# Patient Record
Sex: Male | Born: 1990 | Race: White | Hispanic: No | Marital: Single | Smoking: Current every day smoker
Health system: Southern US, Community
[De-identification: ages and names within clinical notes are randomized; demographics above are authoritative.]

## PROBLEM LIST (undated history)

## (undated) DIAGNOSIS — J45909 Unspecified asthma, uncomplicated: Secondary | ICD-10-CM

## (undated) HISTORY — PX: WISDOM TOOTH EXTRACTION: SHX21

---
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
## 2013-12-21 ENCOUNTER — Encounter (HOSPITAL_COMMUNITY): Payer: Self-pay | Admitting: Emergency Medicine

## 2013-12-21 ENCOUNTER — Emergency Department (HOSPITAL_COMMUNITY)
Admission: EM | Admit: 2013-12-21 | Discharge: 2013-12-21 | Disposition: A | Discharge status: Home / Self Care | Payer: Self-pay | Attending: Emergency Medicine | Admitting: Emergency Medicine

## 2013-12-21 DIAGNOSIS — R1084 Generalized abdominal pain: Secondary | ICD-10-CM | POA: Insufficient documentation

## 2013-12-21 DIAGNOSIS — Z880 Allergy status to penicillin: Secondary | ICD-10-CM | POA: Insufficient documentation

## 2013-12-21 DIAGNOSIS — F17200 Nicotine dependence, unspecified, uncomplicated: Secondary | ICD-10-CM | POA: Insufficient documentation

## 2013-12-21 DIAGNOSIS — R109 Unspecified abdominal pain: Secondary | ICD-10-CM | POA: Insufficient documentation

## 2013-12-21 DIAGNOSIS — R112 Nausea with vomiting, unspecified: Secondary | ICD-10-CM | POA: Insufficient documentation

## 2013-12-21 LAB — COMPREHENSIVE METABOLIC PANEL
ALBUMIN: 3.8 g/dL (ref 3.5–5.2)
ALK PHOS: 71 U/L (ref 39–117)
ALT: 23 U/L (ref 0–53)
AST: 19 U/L (ref 0–37)
Anion gap: 13 (ref 5–15)
BUN: 16 mg/dL (ref 6–23)
CO2: 25 mEq/L (ref 19–32)
Calcium: 9.3 mg/dL (ref 8.4–10.5)
Chloride: 104 mEq/L (ref 96–112)
Creatinine, Ser: 1.11 mg/dL (ref 0.50–1.35)
GFR calc Af Amer: >90 mL/min (ref >90)
GFR calc non Af Amer: >90 mL/min (ref >90)
Glucose, Bld: 115 mg/dL — ABNORMAL HIGH (ref 70–99)
POTASSIUM: 3.8 meq/L (ref 3.7–5.3)
SODIUM: 142 meq/L (ref 137–147)
TOTAL PROTEIN: 6.9 g/dL (ref 6.0–8.3)
Total Bilirubin: 0.4 mg/dL (ref 0.3–1.2)

## 2013-12-21 LAB — URINALYSIS, ROUTINE W REFLEX MICROSCOPIC
BILIRUBIN URINE: NEGATIVE
Glucose, UA: NEGATIVE mg/dL
Hgb urine dipstick: NEGATIVE
KETONES UR: NEGATIVE mg/dL
Leukocytes, UA: NEGATIVE
NITRITE: NEGATIVE
Protein, ur: NEGATIVE mg/dL
Specific Gravity, Urine: 1.024 (ref 1.005–1.030)
UROBILINOGEN UA: 1 mg/dL (ref 0.0–1.0)
pH: 6 (ref 5.0–8.0)

## 2013-12-21 LAB — CBC WITH DIFFERENTIAL/PLATELET
BASOS PCT: 1 % (ref 0–1)
Basophils Absolute: 0.1 10*3/uL (ref 0.0–0.1)
EOS ABS: 0.2 10*3/uL (ref 0.0–0.7)
Eosinophils Relative: 3 % (ref 0–5)
HCT: 46.4 % (ref 39.0–52.0)
Hemoglobin: 16.5 g/dL (ref 13.0–17.0)
LYMPHS ABS: 3.5 10*3/uL (ref 0.7–4.0)
Lymphocytes Relative: 35 % (ref 12–46)
MCH: 30.1 pg (ref 26.0–34.0)
MCHC: 35.6 g/dL (ref 30.0–36.0)
MCV: 84.7 fL (ref 78.0–100.0)
Monocytes Absolute: 0.5 10*3/uL (ref 0.1–1.0)
Monocytes Relative: 6 % (ref 3–12)
NEUTROS ABS: 5.5 10*3/uL (ref 1.7–7.7)
NEUTROS PCT: 55 % (ref 43–77)
PLATELETS: 272 10*3/uL (ref 150–400)
RBC: 5.48 MIL/uL (ref 4.22–5.81)
RDW: 12.7 % (ref 11.5–15.5)
WBC: 9.8 10*3/uL (ref 4.0–10.5)

## 2013-12-21 LAB — LIPASE, BLOOD: Lipase: 21 U/L (ref 11–59)

## 2013-12-21 MED ORDER — ONDANSETRON HCL 4 MG/2ML IJ SOLN
4.0000 mg | Freq: Once | INTRAMUSCULAR | Status: AC
  Administered 2013-12-21: 4 mg via INTRAVENOUS

## 2013-12-21 MED ORDER — MORPHINE SULFATE 4 MG/ML IJ SOLN: 4.0000 mg | Freq: Once | INTRAMUSCULAR | Status: DC

## 2013-12-21 MED ORDER — HYDROCODONE-ACETAMINOPHEN 5-325 MG PO TABS: 1.0000 | ORAL_TABLET | Freq: Four times a day (QID) | ORAL | Status: DC | PRN

## 2013-12-21 MED ORDER — PROMETHAZINE HCL 25 MG PO TABS: 25.0000 mg | ORAL_TABLET | Freq: Four times a day (QID) | ORAL | Status: DC | PRN

## 2013-12-21 MED ORDER — SODIUM CHLORIDE 0.9 % IV BOLUS (SEPSIS)
1000.0000 mL | Freq: Once | INTRAVENOUS | Status: AC
  Administered 2013-12-21: 1000 mL via INTRAVENOUS

## 2013-12-21 NOTE — ED Provider Notes (Signed)
CSN: [id number]     Arrival date & time 12/21/13  [id number] History   First MD Initiated Contact with Patient 12/21/13 [id number]     Chief Complaint  Patient presents with  . Abdominal Pain     (Consider location/radiation/quality/duration/timing/severity/associated sxs/prior Treatment) HPI Comments: Patient is a 23 year old male who presents emergency Department with nausea, vomiting, abdominal pain since 4:30 this morning. He reports he has a cramping abdominal pain generalized in his abdomen. The pain does not radiate to his back. Emesis is nonbloody, nonbilious. He denies any diarrhea. No urinary symptoms. No fevers, chills. He ate [employer] at 2:30 this morning. He believes this could be contributing to his pain. No chest pain, shortness of breath.  Patient is a 23 y.o. male presenting with abdominal pain. The history is provided by the patient. No language interpreter was used.  Abdominal Pain Associated symptoms: nausea and vomiting   Associated symptoms: no chest pain, no chills, no diarrhea, no dysuria, no fever and no shortness of breath     History reviewed. No pertinent past medical history. History reviewed. No pertinent past surgical history. History reviewed. No pertinent family history. History  Substance Use Topics  . Smoking status: Current Some Day Smoker  . Smokeless tobacco: Not on file  . Alcohol Use: No    Review of Systems  Constitutional: Negative for fever and chills.  Respiratory: Negative for shortness of breath.   Cardiovascular: Negative for chest pain.  Gastrointestinal: Positive for nausea, vomiting and abdominal pain. Negative for diarrhea.  Genitourinary: Negative for dysuria, urgency and frequency.  All other systems reviewed and are negative.     Allergies  Penicillins  Home Medications   Prior to Admission medications   Not on File   BP 132/93  Pulse 86  Temp(Src) 97.9 F (36.6 C) (Oral)  Resp 16  Ht 5\' 10"  (1.778 m)  Wt 180 lb (81.647  kg)  BMI 25.83 kg/m2  SpO2 99% Physical Exam  Nursing note and vitals reviewed. Constitutional: He is oriented to person, place, and time. He appears well-developed and well-nourished. He does not appear ill. No distress.  HENT:  Head: Normocephalic and atraumatic.  Right Ear: External ear normal.  Left Ear: External ear normal.  Nose: Nose normal.  Eyes: Conjunctivae are normal.  Neck: Normal range of motion. No tracheal deviation present.  Cardiovascular: Normal rate, regular rhythm and normal heart sounds.   Pulmonary/Chest: Effort normal and breath sounds normal. No stridor.  Abdominal: Soft. He exhibits no distension. There is generalized tenderness. There is no rigidity, no guarding and negative Murphy's sign.  Musculoskeletal: Normal range of motion.  Neurological: He is alert and oriented to person, place, and time.  Skin: Skin is warm and dry. He is not diaphoretic.  Psychiatric: He has a normal mood and affect. His behavior is normal.    ED Course  Procedures (including critical care time) Labs Review Labs Reviewed  COMPREHENSIVE METABOLIC PANEL - Abnormal; Notable for the following:    Glucose, Bld 115 (*)    All other components within normal limits  URINE CULTURE  CBC WITH DIFFERENTIAL  LIPASE, BLOOD  URINALYSIS, ROUTINE W REFLEX MICROSCOPIC    Imaging Review No results found.   EKG Interpretation None      MDM   Final diagnoses:  Generalized abdominal pain    Patient is nontoxic, nonseptic appearing, in no apparent distress.  Patient's pain and other symptoms adequately managed in emergency department.  Fluid bolus given.  Labs, imaging  and vitals reviewed.  Patient does not meet the SIRS or Sepsis criteria.  On repeat exam patient does not have a surgical abdomin and there are nor peritoneal signs.  No indication of appendicitis, bowel obstruction, bowel perforation, cholecystitis, diverticulitis.  Patient discharged home with symptomatic treatment and  given strict instructions for follow-up with their primary care physician.  I have also discussed reasons to return immediately to the ER.  Patient expresses understanding and agrees with plan.       Mora Bellman, PA-C 12/21/13 [phone]

## 2013-12-21 NOTE — Discharge Instructions (Signed)

## 2013-12-21 NOTE — ED Notes (Signed)
Believes he has food poisoning from [employer] - having some cramping

## 2013-12-21 NOTE — ED Notes (Signed)
MD at bedside. 

## 2013-12-21 NOTE — ED Notes (Signed)
Pt declines wheelchair.

## 2013-12-21 NOTE — ED Provider Notes (Signed)
Medical screening examination/treatment/procedure(s) were performed by non-physician practitioner and as supervising physician I was immediately available for consultation/collaboration.   EKG Interpretation None       Lilyth Lawyer L Shawnese Magner, MD 12/21/13 1844 

## 2013-12-22 LAB — URINE CULTURE
COLONY COUNT: NO GROWTH
CULTURE: NO GROWTH

## 2014-05-11 ENCOUNTER — Emergency Department (HOSPITAL_COMMUNITY)
Admission: EM | Admit: 2014-05-11 | Discharge: 2014-05-12 | Disposition: A | Discharge status: Home / Self Care | Payer: Self-pay | Attending: Emergency Medicine | Admitting: Emergency Medicine

## 2014-05-11 ENCOUNTER — Encounter (HOSPITAL_COMMUNITY): Payer: Self-pay | Admitting: Emergency Medicine

## 2014-05-11 DIAGNOSIS — Z880 Allergy status to penicillin: Secondary | ICD-10-CM | POA: Insufficient documentation

## 2014-05-11 DIAGNOSIS — Z720 Tobacco use: Secondary | ICD-10-CM | POA: Insufficient documentation

## 2014-05-11 DIAGNOSIS — J029 Acute pharyngitis, unspecified: Secondary | ICD-10-CM | POA: Insufficient documentation

## 2014-05-11 MED ORDER — LIDOCAINE VISCOUS 2 % MT SOLN: 20.0000 mL | OROMUCOSAL | Status: DC | PRN

## 2014-05-11 MED ORDER — CETIRIZINE HCL 10 MG PO CAPS: 10.0000 mg | ORAL_CAPSULE | Freq: Every day | ORAL | Status: DC

## 2014-05-11 MED ORDER — IBUPROFEN 800 MG PO TABS: 800.0000 mg | ORAL_TABLET | Freq: Three times a day (TID) | ORAL | Status: DC

## 2014-05-11 MED ORDER — GUAIFENESIN 100 MG/5ML PO SOLN: 5.0000 mL | ORAL | Status: DC | PRN

## 2014-05-11 NOTE — ED Notes (Signed)
Pt. reports sore throat for 3 days with occasional dry cough , denies fever or chills. Airway intact / respirations unlabored .

## 2014-05-11 NOTE — ED Provider Notes (Signed)
CSN: [id number]     Arrival date & time 05/11/14  2320 History  This chart was scribed for Eben Burowourtney A Forcucci, PA-C, working with Richardean Canalavid H Yao, MD by Elon SpannerGarrett Cook, ED Scribe. This patient was seen in room TR05C/TR05C and the patient's care was started at 11:40 PM.    Chief Complaint  Patient presents with  . Sore Throat   The history is provided by the patient. No language interpreter was used.   HPI Comments: Alejandro Young is a 24 y.o. male who presents to the Emergency Department complaining of a sore throat with a cough productive of clear mucous onset 2 days ago.  He also reports associated intermittent rhinorrhea and congestion.  Patient denies recent sick contacts.  Patient denies any other medical conditions or taking any medications.  Patient denies fever, chills, nausea, vomiting History reviewed. No pertinent past medical history. No past surgical history on file. No family history on file. History  Substance Use Topics  . Smoking status: Current Some Day Smoker  . Smokeless tobacco: Not on file  . Alcohol Use: No    Review of Systems  Constitutional: Negative for fever and chills.  HENT: Positive for congestion, rhinorrhea and sore throat.   Respiratory: Positive for cough.   Gastrointestinal: Negative for nausea and vomiting.  All other systems reviewed and are negative.     Allergies  Amoxicillin and Penicillins  Home Medications   Prior to Admission medications   Medication Sig Start Date End Date Taking? Authorizing Provider  Cetirizine HCl (ZYRTEC ALLERGY) 10 MG CAPS Take 1 capsule (10 mg total) by mouth at bedtime. 05/11/14   Daking Westervelt A Forcucci, PA-C  guaiFENesin (ROBITUSSIN) 100 MG/5ML SOLN Take 5 mLs (100 mg total) by mouth every 4 (four) hours as needed for cough or to loosen phlegm. 05/11/14   Ronika Kelson A Forcucci, PA-C  HYDROcodone-acetaminophen (NORCO/VICODIN) 5-325 MG per tablet Take 1-2 tablets by mouth every 6 (six) hours as needed for moderate pain or  severe pain. 12/21/13   Mora BellmanHannah S Merrell, PA-C  ibuprofen (ADVIL,MOTRIN) 800 MG tablet Take 1 tablet (800 mg total) by mouth 3 (three) times daily. 05/11/14   Jhordan Mckibben A Forcucci, PA-C  lidocaine (XYLOCAINE) 2 % solution Use as directed 20 mLs in the mouth or throat as needed for mouth pain. 05/11/14   Diron Haddon A Forcucci, PA-C  promethazine (PHENERGAN) 25 MG tablet Take 1 tablet (25 mg total) by mouth every 6 (six) hours as needed for nausea or vomiting. 12/21/13   Mora BellmanHannah S Merrell, PA-C   BP 129/76 mmHg  Pulse 68  Temp(Src) 98.4 F (36.9 C) (Oral)  Resp 14  Ht 5\' 10"  (1.778 m)  Wt 190 lb (86.183 kg)  BMI 27.26 kg/m2  SpO2 94% Physical Exam  Constitutional: He is oriented to person, place, and time. He appears well-developed and well-nourished. No distress.  HENT:  Head: Normocephalic and atraumatic.  Mouth/Throat: Uvula is midline. No trismus in the jaw. Posterior oropharyngeal edema and posterior oropharyngeal erythema present. No oropharyngeal exudate.  Bilateral TM's normal.  No exudate.  There is erythema of the tonsils.  Midlline uvula rise.   Eyes: Conjunctivae and EOM are normal. Pupils are equal, round, and reactive to light. No scleral icterus.  Neck: Normal range of motion. Neck supple. No JVD present. No thyromegaly present.  Cardiovascular: Normal rate, regular rhythm, normal heart sounds and intact distal pulses.  Exam reveals no gallop and no friction rub.   No murmur heard. Pulmonary/Chest: Effort normal  and breath sounds normal. No respiratory distress. He has no wheezes. He has no rales. He exhibits no tenderness.  Musculoskeletal: Normal range of motion.  Lymphadenopathy:    He has no cervical adenopathy.  Neurological: He is alert and oriented to person, place, and time.  Skin: Skin is warm and dry.  Psychiatric: He has a normal mood and affect. His behavior is normal. Judgment and thought content normal.  Nursing note and vitals reviewed.   ED Course  Procedures  (including critical care time)  DIAGNOSTIC STUDIES: Oxygen Saturation is 94% on RA, adequate by my interpretation.    COORDINATION OF CARE:  11:42 PM Will prescribe cough medication, oral numbing rinse.  Advised patient to use ibuprofen, anti-histamine and Neti pot.  Patient acknowledges and agrees with plan.    Labs Review Labs Reviewed - No data to display  Imaging Review No results found.   EKG Interpretation None      MDM   Final diagnoses:  Viral pharyngitis   Patient is a 24 year old male who presents emergency room for evaluation of sore throat. Physical exam reveals alert nontoxic-appearing male with stable vital signs. There is no evidence for peritonsillar abscess or trismus. Patient has Centor score of 0. Suspect that this is a viral pharyngitis or sacral respiratory infection. We'll discharge home with Robitussin, viscous lidocaine, ibuprofen, and Zyrtec. Patient to use nasal saline. Patient to return for difficulty with swallowing, trismus, or any other concerning symptoms. He states understanding and agreement at this time. Patient is stable for discharge.  I personally performed the services described in this documentation, which was scribed in my presence. The recorded information has been reviewed and is accurate.   Eben Burow, PA-C 05/12/14 0000  Richardean Canal, MD 05/12/14 0700

## 2014-05-12 MED ORDER — IBUPROFEN 400 MG PO TABS
800.0000 mg | ORAL_TABLET | Freq: Once | ORAL | Status: AC
  Administered 2014-05-12: 800 mg via ORAL
  Filled 2014-05-12: qty 4

## 2014-05-12 MED ORDER — LIDOCAINE VISCOUS 2 % MT SOLN
15.0000 mL | Freq: Once | OROMUCOSAL | Status: AC
  Administered 2014-05-12: 15 mL via OROMUCOSAL
  Filled 2014-05-12: qty 15

## 2014-05-12 NOTE — Discharge Instructions (Signed)
Pharyngitis °Pharyngitis is redness, pain, and swelling (inflammation) of your pharynx.  °CAUSES  °Pharyngitis is usually caused by infection. Most of the time, these infections are from viruses (viral) and are part of a cold. However, sometimes pharyngitis is caused by bacteria (bacterial). Pharyngitis can also be caused by allergies. Viral pharyngitis may be spread from person to person by coughing, sneezing, and personal items or utensils (cups, forks, spoons, toothbrushes). Bacterial pharyngitis may be spread from person to person by more intimate contact, such as kissing.  °SIGNS AND SYMPTOMS  °Symptoms of pharyngitis include:   °· Sore throat.   °· Tiredness (fatigue).   °· Low-grade fever.   °· Headache. °· Joint pain and muscle aches. °· Skin rashes. °· Swollen lymph nodes. °· Plaque-like film on throat or tonsils (often seen with bacterial pharyngitis). °DIAGNOSIS  °Your health care provider will ask you questions about your illness and your symptoms. Your medical history, along with a physical exam, is often all that is needed to diagnose pharyngitis. Sometimes, a rapid strep test is done. Other lab tests may also be done, depending on the suspected cause.  °TREATMENT  °Viral pharyngitis will usually get better in 3-4 days without the use of medicine. Bacterial pharyngitis is treated with medicines that kill germs (antibiotics).  °HOME CARE INSTRUCTIONS  °· Drink enough water and fluids to keep your urine clear or pale yellow.   °· Only take over-the-counter or prescription medicines as directed by your health care provider:   °¨ If you are prescribed antibiotics, make sure you finish them even if you start to feel better.   °¨ Do not take aspirin.   °· Get lots of rest.   °· Gargle with 8 oz of salt water (½ tsp of salt per 1 qt of water) as often as every 1-2 hours to soothe your throat.   °· Throat lozenges (if you are not at risk for choking) or sprays may be used to soothe your throat. °SEEK MEDICAL  CARE IF:  °· You have large, tender lumps in your neck. °· You have a rash. °· You cough up green, yellow-brown, or bloody spit. °SEEK IMMEDIATE MEDICAL CARE IF:  °· Your neck becomes stiff. °· You drool or are unable to swallow liquids. °· You vomit or are unable to keep medicines or liquids down. °· You have severe pain that does not go away with the use of recommended medicines. °· You have trouble breathing (not caused by a stuffy nose). °MAKE SURE YOU:  °· Understand these instructions. °· Will watch your condition. °· Will get help right away if you are not doing well or get worse. °Document Released: 04/20/2005 Document Revised: 02/08/2013 Document Reviewed: 12/26/2012 °ExitCare® Patient Information ©2015 [employer]. This information is not intended to replace advice given to you by your health care provider. Make sure you discuss any questions you have with your health care provider. ° ° °Emergency Department Resource Guide °1) Find a Doctor and Pay Out of Pocket °Although you won't have to find out who is covered by your insurance plan, it is a good idea to ask around and get recommendations. You will then need to call the office and see if the doctor you have chosen will accept you as a new patient and what types of options they offer for patients who are self-pay. Some doctors offer discounts or will set up payment plans for their patients who do not have insurance, but you will need to ask so you aren't surprised when you get to your appointment. ° °  2) Contact Your Local Health Department °Not all health departments have doctors that can see patients for sick visits, but many do, so it is worth a call to see if yours does. If you don't know where your local health department is, you can check in your phone book. The CDC also has a tool to help you locate your state's health department, and many state websites also have listings of all of their local health departments. ° °3) Find a Walk-in Clinic °If  your illness is not likely to be very severe or complicated, you may want to try a walk in clinic. These are popping up all over the country in pharmacies, drugstores, and shopping centers. They're usually staffed by nurse practitioners or physician assistants that have been trained to treat common illnesses and complaints. They're usually fairly quick and inexpensive. However, if you have serious medical issues or chronic medical problems, these are probably not your best option. ° °No Primary Care Doctor: °- Call [facility] at  832-8000 - they can help you locate a primary care doctor that  accepts your insurance, provides certain services, etc. °- Physician Referral Service- 1-800-533-3463 ° °Chronic Pain Problems: °Organization         Address  Phone   Notes  °[facility]  (336) 297-2271 Patients need to be referred by their primary care doctor.  ° °Medication Assistance: °Organization         Address  Phone   Notes  °[place] [facility] [address] °[place], [place] [address] (336) 641-8030 --Must be a resident of [place] °-- Must have NO insurance coverage whatsoever (no Medicaid/ Medicare, etc.) °-- The pt. MUST have a primary care doctor that directs their care regularly and follows them in the community °  °[facility]  (866) 331-1348   °[employer]  (888) 892-1162   ° °Agencies that provide inexpensive medical care: °Organization         Address  Phone   Notes  °[facility]  (336) 832-8035   °[facility]    (336) 832-7272   °[facility] [address] °[place], [place] [address] (336) 832-4777   °[facility] [address], °[place] (336) 271-4999   °[facility]    (336) 373-0678   °[facility]    (336) 272-1050   °[facility] ° [address], [place] Phone:  (336) 832-4444, Fax:  (336) 832-4440 Hours of  Operation:  9 am - 6 pm, M-F.  Also accepts Medicaid/Medicare and self-pay.  °[facility] ° [address], [place] Phone: (336) 832-3150, Fax: (336) 832-3151. Hours of Operation:  8:30 am - 5:30 pm, M-F.  Also accepts Medicaid and self-pay.  °[facility] [place] [address], [place] Phone: (336) 878-6027   °[facility] [address], [place], [place] (336)723-1848, Ext. 123 Mondays & Thursdays: 7-9 AM.  First 15 patients are seen on a first come, first serve basis. °  ° °Medicaid-accepting [place] Providers: ° °Organization         Address  Phone   Notes  °[facility] [address], [place] (336) 641-2100 Also accepts self-pay patients.  °[facility] [address], [place] ° (336) 856-9996   °[facility] [address], [place] (336) 288-8857   °[facility] [address] [place] [address], [place] (336) 299-7000   °Veita Bland [address], [place]  ° (336) 373-1557 Only accepts [place] Access Medicaid patients after they have their name applied to their card.  ° °Self-Pay (no insurance) in [place]: ° °Organization         Address  Phone   Notes  °Sickle Cell Patients, [facility] [address], [place] (336) 832-1970   °[facility] [address], [place] (336) 832-4400   °[facility] ° [address], [place] (336) 992-4800   °[facility]/Dr. Osei-Bonsu ° [address] [place] [address], [place] or [address], [place] (336) 841-8500 Phone number for both [place] and [place] locations is the same.  °[facility] [address], [place] (336) 299-0000   °[facility] [address] [place] [address], [place] or [address] (336) 852-7530 °(336) 878-2260   °[facility] [address], [place] (336) 350-1642, phone; (336) 294-5005, fax Sees patients 1st and 3rd Saturday of every month.  Must not qualify for public or private insurance (i.e. Medicaid, Medicare, [place] Health Choice, Veterans' Benefits) • Household income should be no more than 200% of the poverty level •The clinic cannot treat you if you are pregnant or think you are pregnant • Sexually transmitted diseases are not treated at the clinic.  ° ° °Dental Care: °Organization         Address  Phone  Notes  °[facility] [address], [place] (336) 641-6152 Accepts children up to age 21 who are enrolled in Medicaid or [place] Health Choice; pregnant women with a Medicaid card; and children who have applied for Medicaid or [place] Health Choice, but were declined, whose parents can pay a reduced fee at time of service.  °[facility] [place]  [address], [place] (336) 641-7733 Accepts children up to age 21 who are enrolled in Medicaid or [place] Health Choice; pregnant women with a Medicaid card; and children who have applied for Medicaid or  Health Choice, but were declined, whose parents can pay a reduced fee at time of service.  °[facility] ° [address],  (336) 641-4533 Patients are seen by appointment only. Walk-ins are not accepted. [facility] will see patients 18 years of age and older. °Monday - Tuesday (8am-5pm) °Most Wednesdays (8:30-5pm) °$30 per visit, cash only  °[facility] ° [address], [place] (336) 641-4533 Patients are seen by appointment only. Walk-ins are not accepted. [facility] will see patients 18 years of age and older. °One Wednesday Evening (Monthly: Volunteer Based).  $30 per visit, cash only  °[facility]  (919) 537-3737 for adults; Children under age 4, call Graduate Pediatric  Dentistry at ([phone]) [phone]. Children aged 4-14, please call (919) [phone] to request a pediatric application. ° Dental services are provided in all areas of dental care including fillings, crowns and bridges, complete and partial dentures, implants, gum treatment, root canals, and extractions. Preventive care is also provided. Treatment is provided to both adults and children. °Patients are selected via a lottery and there is often a waiting list. °  °[facility] [address], °  [place] ° (336) 763-8833 [email or web address] °  °[facility] [address], [place], [place] (336)723-1848, Ext. 123 Second and Fourth Thursday of each month, opens at 6:30 AM; Clinic ends at 9 AM.  Patients are seen on a first-come first-served basis, and a limited number are seen during each clinic.  ° °[facility] ° [address], [place], [place] (336) 723-7904   Eligibility Requirements °You must have lived in [place], [place], or [place] counties for at least the last three months. °  You cannot be eligible for state or federal sponsored healthcare insurance, including Veterans Administration, Medicaid, or Medicare. °  You generally cannot be eligible for healthcare insurance through your employer.  °  How to apply: °Eligibility screenings are held every Tuesday and Wednesday afternoon from 1:00 pm until 4:00 pm. You do not need an appointment for the interview!  °[facility] [address], [place], [place] 336-631-2330   °[facility]  336-342-8273   °[facility]  336-703-3100   °[facility]  336-570-6415   ° °Behavioral Health Resources in the Community: °Intensive Outpatient Programs °Organization         Address  Phone  Notes  °[facility] [address], [place], [place] 336-878-6098   °[facility] [address], [place], [place] 336-832-9800   °[facility] [address], [place], [place] ° 336-882-2125   °[facility] [address],  °[place], [place] 1-800-853-5163 or 336-641-4981   °Substance Abuse Resources °Organization         Address  Phone  Notes  °[facility]  336-882-2125   °[facility]  336-784-9470   °[facility]  336-285-9073   °[facility]  336-845-3988   °Residential & Outpatient Substance Abuse Program  1-800-659-3381   °Psychological Services °Organization         Address  Phone  Notes  °[facility]  336- 832-9600   °[facility]  336- 378-7881   °[facility] [address], [place] 1-800-853-5163 or 336-641-4981   ° °Mobile Crisis Teams °Organization         Address  Phone  Notes  °[facility], Mobile Crisis Care Unit  1-877-626-1772   °Assertive °[facility] ° [address] [place], [place] 336-834-9664   °Sharon DeEsch [address] °[place] 336-554-5454   ° °Self-Help/Support Groups °Organization         Address  Phone             Notes  °[facility] - variety of support groups  336- 373-1402 Call for more information  °Narcotics Anonymous (NA), [facility] [address], °[place]  2 meetings at this location  ° °Residential Treatment Programs °Organization         Address  Phone  Notes  °[facility] [address],    °[place]  1-866-801-8205   °[facility] ° [address], [place], [place] 704-293-8524   °[facility] [address], [place] 336-845-3988 Admissions: 8am-3pm M-F  °[facility] [address]    °[place], [place] 336-841-1104   °[facility] [address], [place], [place] 336-379-7146   °[facility] [address]  °[place], [place] 336-285-9073   °[facility] - Intensive Outpatient [address],  [place], [place] 336-852-3033   °[facility] [address]  °[place], [place] 1-877-615-2722 or 336-784-9470   °[facility] ([facility] [address] [place], [place] 336-227-7417 Accepts Medicaid  °[facility] [address]  °[place] 1-800-659-3381 Substance Abuse/Addiction Treatment  ° °[place] Behavioral Health Resources °Organization         Address  Phone  Notes  °[facility]  (888) 581-9988   °Julie Brannon, PhD [address] [place], [place]   (336) 349-5553 or (336) 951-0000   °[facility]   [address] °[place], [place] (336) 349-4454   °[facility] [address], [place], [place] (336) 342-8316 Insurance/Medicaid/sponsorship through [facility]  °[facility] [address]                                    [place], [place] (336) 342-8316 Therapy/tele-psych/case  °[facility] [address]  ° [place], [place] (336) 349-2233    °Dr. Arfeen  (336) 349-4544   °[facility] [place]  [employer] [place] [facility] 1) [address], [place] °2) [address], [place] °3)  [address], [place] (336) 349-3220 °(336) 342-7768 ° °(336) 342-8140   °[place] [facility] (336) 342-1394 or (336) 342-3537 (After Hours)    ° ° ° ° °

## 2014-10-25 ENCOUNTER — Emergency Department (HOSPITAL_COMMUNITY)
Admission: EM | Admit: 2014-10-25 | Discharge: 2014-10-25 | Disposition: A | Discharge status: Home / Self Care | Payer: Self-pay | Attending: Emergency Medicine | Admitting: Emergency Medicine

## 2014-10-25 ENCOUNTER — Encounter (HOSPITAL_COMMUNITY): Payer: Self-pay | Admitting: Emergency Medicine

## 2014-10-25 DIAGNOSIS — Z720 Tobacco use: Secondary | ICD-10-CM | POA: Insufficient documentation

## 2014-10-25 DIAGNOSIS — R360 Urethral discharge without blood: Secondary | ICD-10-CM | POA: Insufficient documentation

## 2014-10-25 DIAGNOSIS — Z880 Allergy status to penicillin: Secondary | ICD-10-CM | POA: Insufficient documentation

## 2014-10-25 DIAGNOSIS — Z791 Long term (current) use of non-steroidal anti-inflammatories (NSAID): Secondary | ICD-10-CM | POA: Insufficient documentation

## 2014-10-25 DIAGNOSIS — R369 Urethral discharge, unspecified: Secondary | ICD-10-CM

## 2014-10-25 LAB — GC/CHLAMYDIA PROBE AMP (~~LOC~~) NOT AT ARMC
Chlamydia: NEGATIVE
Neisseria Gonorrhea: NEGATIVE

## 2014-10-25 MED ORDER — AZITHROMYCIN 250 MG PO TABS
1000.0000 mg | ORAL_TABLET | Freq: Once | ORAL | Status: AC
  Administered 2014-10-25: 1000 mg via ORAL
  Filled 2014-10-25: qty 4

## 2014-10-25 MED ORDER — CEFTRIAXONE SODIUM 250 MG IJ SOLR
250.0000 mg | Freq: Once | INTRAMUSCULAR | Status: AC
  Administered 2014-10-25: 250 mg via INTRAMUSCULAR
  Filled 2014-10-25: qty 250

## 2014-10-25 MED ORDER — STERILE WATER FOR INJECTION IJ SOLN
INTRAMUSCULAR | Status: AC
  Administered 2014-10-25: 1.9 mL
  Filled 2014-10-25: qty 10

## 2014-10-25 NOTE — ED Notes (Signed)
Pt c/o "yellowish-white discharge" from penis, noticed yesterday.  Pt denies pain, denies dysuria.  NAD noted at this time, resp e/u.

## 2014-10-25 NOTE — ED Provider Notes (Signed)
CSN: [id number]     Arrival date & time 10/25/14  4098 History   First MD Initiated Contact with Patient 10/25/14 0602     Chief Complaint  Patient presents with  . SEXUALLY TRANSMITTED DISEASE     (Consider location/radiation/quality/duration/timing/severity/associated sxs/prior Treatment) The history is provided by the patient and medical records.     This is a 24 y.o. M with no significant PMH presenting to the ED for urethral discharge.  Patient states he first noted this yesterday, yellow/white in color.  He denies dysuria or urinary frequency.  No abdominal pain, testicle pain, flank pain. No fever or chills. No history of STD. Patient is currently sexually active with one male partner, he does have some concern for STD. No intervention tried prior to arrival.  History reviewed. No pertinent past medical history. History reviewed. No pertinent past surgical history. No family history on file. History  Substance Use Topics  . Smoking status: Current Every Day Smoker -- 1.00 packs/day  . Smokeless tobacco: Never Used  . Alcohol Use: No    Review of Systems  Genitourinary: Positive for discharge.  All other systems reviewed and are negative.     Allergies  Amoxicillin and Penicillins  Home Medications   Prior to Admission medications   Medication Sig Start Date End Date Taking? Authorizing Provider  Cetirizine HCl (ZYRTEC ALLERGY) 10 MG CAPS Take 1 capsule (10 mg total) by mouth at bedtime. 05/11/14   Courtney Forcucci, PA-C  guaiFENesin (ROBITUSSIN) 100 MG/5ML SOLN Take 5 mLs (100 mg total) by mouth every 4 (four) hours as needed for cough or to loosen phlegm. 05/11/14   Courtney Forcucci, PA-C  HYDROcodone-acetaminophen (NORCO/VICODIN) 5-325 MG per tablet Take 1-2 tablets by mouth every 6 (six) hours as needed for moderate pain or severe pain. 12/21/13   Junious Silk, PA-C  ibuprofen (ADVIL,MOTRIN) 800 MG tablet Take 1 tablet (800 mg total) by mouth 3 (three) times  daily. 05/11/14   Courtney Forcucci, PA-C  lidocaine (XYLOCAINE) 2 % solution Use as directed 20 mLs in the mouth or throat as needed for mouth pain. 05/11/14   Courtney Forcucci, PA-C  promethazine (PHENERGAN) 25 MG tablet Take 1 tablet (25 mg total) by mouth every 6 (six) hours as needed for nausea or vomiting. 12/21/13   Junious Silk, PA-C   BP 140/89 mmHg  Pulse 64  Temp(Src) 98.5 F (36.9 C) (Oral)  Resp 14  Ht  (1.778 m)  Wt 205 lb (92.987 kg)  BMI 29.41 kg/m2  SpO2 97% Physical Exam  Constitutional: He is oriented to person, place, and time. He appears well-developed and well-nourished.  HENT:  Head: Normocephalic and atraumatic.  Mouth/Throat: Oropharynx is clear and moist.  Eyes: Conjunctivae and EOM are normal. Pupils are equal, round, and reactive to light.  Neck: Normal range of motion.  Cardiovascular: Normal rate, regular rhythm and normal heart sounds.   Pulmonary/Chest: Effort normal and breath sounds normal.  Abdominal: Soft. Bowel sounds are normal.  Genitourinary: Penis normal. Circumcised. No penile erythema or penile tenderness. No discharge found.  Musculoskeletal: Normal range of motion.  Neurological: He is alert and oriented to person, place, and time.  Skin: Skin is warm and dry.  Psychiatric: He has a normal mood and affect.  Nursing note and vitals reviewed.   ED Course  Procedures (including critical care time) Labs Review Labs Reviewed  GC/CHLAMYDIA PROBE AMP ([place]) NOT AT [facility]    Imaging Review No results found.   EKG  Interpretation None      MDM   Final diagnoses:  Urethral discharge   24 year old male here for urethral discharge. He does have concern for STD. Gonorrhea and Chlamydia swab obtained. Patient was treated empirically with azithromycin and Rocephin. He was advised he'll be notified of culture results in 24-48 hours if positive. He will follow-up with the health department for further STD needs.  Discussed plan  with patient, he/she acknowledged understanding and agreed with plan of care.  Return precautions given for new or worsening symptoms.  Garlon Hatchet, PA-C 10/25/14 0801  Loren Racer, MD 10/30/14 [phone]

## 2014-10-25 NOTE — Discharge Instructions (Signed)
You have been treated for gonorrhea and chlamydia. You will be notified of the next 24-48 hours if your culture results are positive. If so, you have artery been treated by her partner will need to be treated. You may follow-up with the health department for further STD needs. Return here for new concerns.

## 2015-11-12 ENCOUNTER — Encounter (HOSPITAL_COMMUNITY): Payer: Self-pay | Admitting: *Deleted

## 2015-11-12 ENCOUNTER — Emergency Department (HOSPITAL_COMMUNITY): Payer: Self-pay

## 2015-11-12 ENCOUNTER — Emergency Department (HOSPITAL_COMMUNITY)
Admission: EM | Admit: 2015-11-12 | Discharge: 2015-11-12 | Disposition: A | Discharge status: Home / Self Care | Payer: Self-pay | Attending: Emergency Medicine | Admitting: Emergency Medicine

## 2015-11-12 DIAGNOSIS — Z79899 Other long term (current) drug therapy: Secondary | ICD-10-CM | POA: Insufficient documentation

## 2015-11-12 DIAGNOSIS — S93402A Sprain of unspecified ligament of left ankle, initial encounter: Secondary | ICD-10-CM | POA: Insufficient documentation

## 2015-11-12 DIAGNOSIS — X503XXA Overexertion from repetitive movements, initial encounter: Secondary | ICD-10-CM | POA: Insufficient documentation

## 2015-11-12 DIAGNOSIS — Y9367 Activity, basketball: Secondary | ICD-10-CM | POA: Insufficient documentation

## 2015-11-12 DIAGNOSIS — F17200 Nicotine dependence, unspecified, uncomplicated: Secondary | ICD-10-CM | POA: Insufficient documentation

## 2015-11-12 DIAGNOSIS — Y999 Unspecified external cause status: Secondary | ICD-10-CM | POA: Insufficient documentation

## 2015-11-12 DIAGNOSIS — Y929 Unspecified place or not applicable: Secondary | ICD-10-CM | POA: Insufficient documentation

## 2015-11-12 NOTE — ED Provider Notes (Signed)
CSN: [id number]     Arrival date & time 11/12/15  1621 History  By signing my name below, I, Soijett Blue, attest that this documentation has been prepared under the direction and in the presence of Bethel Born, PA-C Electronically Signed: Soijett Blue, ED Scribe. 11/12/2015. 7:16 PM.   Chief Complaint  Patient presents with  . Ankle Pain    The history is provided by the patient. No language interpreter was used.    Alejandro Young is a 25 y.o. male who presents to the Emergency Department complaining of left ankle pain onset 2 days. He notes that he rolled his ankle while playing basketball. Pt is having associated symptoms of left ankle and left foot swelling, gait problem due to pain, bruising to left foot, and intermittent left foot pain with ambulation. He notes that he has not tried any medications for the relief of his symptoms. He denies wound, rash, left calf pain, numbness, tingling, and any other symptoms.    History reviewed. No pertinent past medical history. History reviewed. No pertinent past surgical history. History reviewed. No pertinent family history. Social History  Substance Use Topics  . Smoking status: Current Every Day Smoker -- 1.00 packs/day  . Smokeless tobacco: Never Used  . Alcohol Use: No    Review of Systems  Musculoskeletal: Positive for joint swelling (left foot and ankle), arthralgias (left foot and ankle) and gait problem (due to pain). Negative for myalgias.  Skin: Positive for color change (bruising to left foot). Negative for wound.  Neurological: Negative for numbness.       No tingling      Allergies  Amoxicillin and Penicillins  Home Medications   Prior to Admission medications   Medication Sig Start Date End Date Taking? Authorizing Provider  Cetirizine HCl (ZYRTEC ALLERGY) 10 MG CAPS Take 1 capsule (10 mg total) by mouth at bedtime. 05/11/14   Courtney Forcucci, PA-C  guaiFENesin (ROBITUSSIN) 100 MG/5ML SOLN Take 5 mLs (100 mg  total) by mouth every 4 (four) hours as needed for cough or to loosen phlegm. 05/11/14   Courtney Forcucci, PA-C  HYDROcodone-acetaminophen (NORCO/VICODIN) 5-325 MG per tablet Take 1-2 tablets by mouth every 6 (six) hours as needed for moderate pain or severe pain. 12/21/13   Junious Silk, PA-C  ibuprofen (ADVIL,MOTRIN) 800 MG tablet Take 1 tablet (800 mg total) by mouth 3 (three) times daily. 05/11/14   Courtney Forcucci, PA-C  lidocaine (XYLOCAINE) 2 % solution Use as directed 20 mLs in the mouth or throat as needed for mouth pain. 05/11/14   Courtney Forcucci, PA-C  promethazine (PHENERGAN) 25 MG tablet Take 1 tablet (25 mg total) by mouth every 6 (six) hours as needed for nausea or vomiting. 12/21/13   Junious Silk, PA-C   BP 133/78 mmHg  Pulse 93  Temp(Src) 99.1 F (37.3 C) (Oral)  Ht  (1.778 m)  Wt 190 lb (86.183 kg)  BMI 27.26 kg/m2  SpO2 98%   Physical Exam  Constitutional: He is oriented to person, place, and time. He appears well-developed and well-nourished. No distress.  HENT:  Head: Normocephalic and atraumatic.  Eyes: EOM are normal.  Neck: Neck supple.  Cardiovascular: Normal rate.   Pulmonary/Chest: Effort normal. No respiratory distress.  Abdominal: He exhibits no distension.  Musculoskeletal:       Left ankle: He exhibits decreased range of motion (due to swelling), swelling and ecchymosis. Tenderness.       Left foot: There is swelling.  Significant amount  of swelling over circumferential ankle and dorsal aspect of left foot. Ecchymosis around toes and the lateral aspect of ankle. Mild TTP of left ankle. Decreased ROM of ankle due to swelling. FROM of toes. NVI.   Neurological: He is alert and oriented to person, place, and time.  Skin: Skin is warm and dry.  Psychiatric: He has a normal mood and affect. His behavior is normal.  Nursing note and vitals reviewed.   ED Course  Procedures (including critical care time) DIAGNOSTIC STUDIES: Oxygen Saturation is  98% on RA, nl by my interpretation.    COORDINATION OF CARE: 7:16 PM Discussed treatment plan with pt at bedside which includes left ankle xray, brace, ibuprofen, and RICE therapy, and pt agreed to plan.    Imaging Review Dg Ankle Complete Left  11/12/2015  CLINICAL DATA:  Left ankle pain status post basketball injury. EXAM: LEFT ANKLE COMPLETE - 3+ VIEW COMPARISON:  None. FINDINGS: No other acute fracture or dislocation. Small ankle joint effusion. Soft tissue swelling over the lateral malleolus. IMPRESSION: No acute osseous injury of the left ankle. Soft tissue swelling over the lateral malleolus. Electronically Signed   By: Elige KoHetal  Patel   On: 11/12/2015 17:56   I have personally reviewed and evaluated these images as part of my medical decision-making.   MDM   Final diagnoses:  Ankle sprain, left, initial encounter   25 year old male who presents with left ankle sprain. Xray negative for fracture. ASO brace applied. Patient offered crutches and medicine for pain but he declined. RICE protocol recommended and discussed. Patient is NAD, non-toxic, with stable VS. Patient is informed of clinical course, understands medical decision making process, and agrees with plan. Opportunity for questions provided and all questions answered. Return precautions given.  I personally performed the services described in this documentation, which was scribed in my presence. The recorded information has been reviewed and is accurate.    Bethel BornKelly Marie Cadon Raczka, PA-C 11/14/15 1440  Marily MemosJason Mesner, MD 11/15/15 [phone]

## 2015-11-12 NOTE — Discharge Instructions (Signed)
Ankle Sprain  An ankle sprain is an injury to the strong, fibrous tissues (ligaments) that hold the bones of your ankle joint together.   CAUSES  An ankle sprain is usually caused by a fall or by twisting your ankle. Ankle sprains most commonly occur when you step on the outer edge of your foot, and your ankle turns inward. People who participate in sports are more prone to these types of injuries.   SYMPTOMS    Pain in your ankle. The pain may be present at rest or only when you are trying to stand or walk.   Swelling.   Bruising. Bruising may develop immediately or within 1 to 2 days after your injury.   Difficulty standing or walking, particularly when turning corners or changing directions.  DIAGNOSIS   Your caregiver will ask you details about your injury and perform a physical exam of your ankle to determine if you have an ankle sprain. During the physical exam, your caregiver will press on and apply pressure to specific areas of your foot and ankle. Your caregiver will try to move your ankle in certain ways. An X-ray exam may be done to be sure a bone was not broken or a ligament did not separate from one of the bones in your ankle (avulsion fracture).   TREATMENT   Certain types of braces can help stabilize your ankle. Your caregiver can make a recommendation for this. Your caregiver may recommend the use of medicine for pain. If your sprain is severe, your caregiver may refer you to a surgeon who helps to restore function to parts of your skeletal system (orthopedist) or a physical therapist.  HOME CARE INSTRUCTIONS    Apply ice to your injury for 1-2 days or as directed by your caregiver. Applying ice helps to reduce inflammation and pain.    Put ice in a plastic bag.    Place a towel between your skin and the bag.    Leave the ice on for 15-20 minutes at a time, every 2 hours while you are awake.   Only take over-the-counter or prescription medicines for pain, discomfort, or fever as directed by  your caregiver.   Elevate your injured ankle above the level of your heart as much as possible for 2-3 days.   If your caregiver recommends crutches, use them as instructed. Gradually put weight on the affected ankle. Continue to use crutches or a cane until you can walk without feeling pain in your ankle.   If you have a plaster splint, wear the splint as directed by your caregiver. Do not rest it on anything harder than a pillow for the first 24 hours. Do not put weight on it. Do not get it wet. You may take it off to take a shower or bath.   You may have been given an elastic bandage to wear around your ankle to provide support. If the elastic bandage is too tight (you have numbness or tingling in your foot or your foot becomes cold and blue), adjust the bandage to make it comfortable.   If you have an air splint, you may blow more air into it or let air out to make it more comfortable. You may take your splint off at night and before taking a shower or bath. Wiggle your toes in the splint several times per day to decrease swelling.  SEEK MEDICAL CARE IF:    You have rapidly increasing bruising or swelling.   Your toes feel   extremely cold or you lose feeling in your foot.   Your pain is not relieved with medicine.  SEEK IMMEDIATE MEDICAL CARE IF:   Your toes are numb or blue.   You have severe pain that is increasing.  MAKE SURE YOU:    Understand these instructions.   Will watch your condition.   Will get help right away if you are not doing well or get worse.     This information is not intended to replace advice given to you by your health care provider. Make sure you discuss any questions you have with your health care provider.     Document Released: 04/20/2005 Document Revised: 05/11/2014 Document Reviewed: 05/02/2011  Elsevier Interactive Patient Education 2016 [employer]

## 2015-11-12 NOTE — ED Notes (Addendum)
Pt rolled left ankle on Sunday while playing basketball. Pt presents with swelling to left ankle. Pt wants to know if ankle is broken.

## 2016-11-01 ENCOUNTER — Encounter (HOSPITAL_COMMUNITY): Payer: Self-pay | Admitting: *Deleted

## 2016-11-01 ENCOUNTER — Emergency Department (HOSPITAL_COMMUNITY)
Admission: EM | Admit: 2016-11-01 | Discharge: 2016-11-01 | Disposition: A | Discharge status: Home / Self Care | Payer: 59 | Attending: Emergency Medicine | Admitting: Emergency Medicine

## 2016-11-01 DIAGNOSIS — F17200 Nicotine dependence, unspecified, uncomplicated: Secondary | ICD-10-CM | POA: Diagnosis not present

## 2016-11-01 DIAGNOSIS — T148XXA Other injury of unspecified body region, initial encounter: Secondary | ICD-10-CM

## 2016-11-01 DIAGNOSIS — Y9389 Activity, other specified: Secondary | ICD-10-CM | POA: Diagnosis not present

## 2016-11-01 DIAGNOSIS — Y998 Other external cause status: Secondary | ICD-10-CM | POA: Diagnosis not present

## 2016-11-01 DIAGNOSIS — M545 Low back pain, unspecified: Secondary | ICD-10-CM

## 2016-11-01 DIAGNOSIS — Y929 Unspecified place or not applicable: Secondary | ICD-10-CM | POA: Insufficient documentation

## 2016-11-01 DIAGNOSIS — Z79899 Other long term (current) drug therapy: Secondary | ICD-10-CM | POA: Diagnosis not present

## 2016-11-01 DIAGNOSIS — S39012A Strain of muscle, fascia and tendon of lower back, initial encounter: Secondary | ICD-10-CM | POA: Diagnosis not present

## 2016-11-01 DIAGNOSIS — X500XXA Overexertion from strenuous movement or load, initial encounter: Secondary | ICD-10-CM | POA: Diagnosis not present

## 2016-11-01 MED ORDER — METHOCARBAMOL 500 MG PO TABS: 500.0000 mg | ORAL_TABLET | Freq: Two times a day (BID) | ORAL | 0 refills | Status: DC

## 2016-11-01 MED ORDER — IBUPROFEN 600 MG PO TABS: 600.0000 mg | ORAL_TABLET | Freq: Four times a day (QID) | ORAL | 0 refills | Status: DC | PRN

## 2016-11-01 NOTE — ED Provider Notes (Signed)
[facility]-EMERGENCY DEPT Provider Note    By signing my name below, I, Earmon PhoenixJennifer Waddell, attest that this documentation has been prepared under the direction and in the presence of Graciella FreerLindsey Jamesha Ellsworth, PA-C. Electronically Signed: Earmon PhoenixJennifer Waddell, ED Scribe. 11/01/16. 1:55 PM.    History   Chief Complaint Chief Complaint  Patient presents with  . Back Pain    The history is provided by the patient and medical records. No language interpreter was used.    Alejandro Young is a 26 y.o. male who presents to the Emergency Department complaining of mid to lower back pain that began last night. He reports associated sharp pain that radiates from the mid back to the right shoulder blade. He reports lifting his son last night prior to the pain beginning. He has not taken anything for pain. Bending increases the pain. Sitting straight up helps alleviate the pain. He denies numbness, tingling or weakness of any extremity, saddle anesthesia, fever, chills, bowel or bladder incontinence, dysuria, hematuria, neck pain, rash. He denies h/o back problems or surgeries or IVDA. He reports an allergy to PCN. He does not currently have a PCP.    History reviewed. No pertinent past medical history.  There are no active problems to display for this patient.   History reviewed. No pertinent surgical history.     Home Medications    Prior to Admission medications   Medication Sig Start Date End Date Taking? Authorizing Provider  Cetirizine HCl (ZYRTEC ALLERGY) 10 MG CAPS Take 1 capsule (10 mg total) by mouth at bedtime. 05/11/14   Forcucci, Courtney, PA-C  guaiFENesin (ROBITUSSIN) 100 MG/5ML SOLN Take 5 mLs (100 mg total) by mouth every 4 (four) hours as needed for cough or to loosen phlegm. 05/11/14   Forcucci, Courtney, PA-C  HYDROcodone-acetaminophen (NORCO/VICODIN) 5-325 MG per tablet Take 1-2 tablets by mouth every 6 (six) hours as needed for moderate pain or severe pain. 12/21/13   Junious SilkMerrell, Hannah, PA-C    ibuprofen (ADVIL,MOTRIN) 600 MG tablet Take 1 tablet (600 mg total) by mouth every 6 (six) hours as needed. 11/01/16   Maxwell CaulLayden, Kainoah Bartosiewicz A, PA-C  lidocaine (XYLOCAINE) 2 % solution Use as directed 20 mLs in the mouth or throat as needed for mouth pain. 05/11/14   Forcucci, Courtney, PA-C  methocarbamol (ROBAXIN) 500 MG tablet Take 1 tablet (500 mg total) by mouth 2 (two) times daily. 11/01/16   Maxwell CaulLayden, Karmah Potocki A, PA-C  promethazine (PHENERGAN) 25 MG tablet Take 1 tablet (25 mg total) by mouth every 6 (six) hours as needed for nausea or vomiting. 12/21/13   Junious SilkMerrell, Hannah, PA-C    Family History History reviewed. No pertinent family history.  Social History Social History  Substance Use Topics  . Smoking status: Current Every Day Smoker    Packs/day: 1.00  . Smokeless tobacco: Never Used  . Alcohol use No     Allergies   Amoxicillin and Penicillins   Review of Systems Review of Systems  Constitutional: Negative for chills and fever.  Gastrointestinal:       No bowel or bladder incontinence  Genitourinary: Negative for dysuria and hematuria.  Musculoskeletal: Positive for back pain. Negative for neck pain.  Skin: Negative for rash.  Neurological: Negative for weakness and numbness.     Physical Exam Updated Vital Signs BP 134/81 (BP Location: Right Arm)   Pulse 80   Temp 98.6 F (37 C) (Oral)   Resp 18   SpO2 99%   Physical Exam  Constitutional: He is  oriented to person, place, and time. He appears well-developed and well-nourished.  Sitting comfortably on examination table  HENT:  Head: Normocephalic and atraumatic.  Eyes: Conjunctivae and EOM are normal. Right eye exhibits no discharge. Left eye exhibits no discharge. No scleral icterus.  Neck: Full passive range of motion without pain. Neck supple. No neck rigidity.  Full flexion/extension and lateral movement of neck fully intact. No bony midline tenderness. No deformities or crepitus.  Cardiovascular: Normal pulses.    Pulmonary/Chest: Effort normal.  Abdominal: There is no CVA tenderness.  No CVA tenderness bilaterally  Musculoskeletal: Normal range of motion. He exhibits tenderness. He exhibits no edema or deformity.  Diffuse TTP over right trapezius. Diffuse muscular tenderness overlying the lumbar region. No warm, erythema or ecchymosis noted. No bony tenderness. No deformities or crepitus. Flexion and extension of back intact without difficulty. FROM BUE. No tenderness to palpation to bilateral shoulders, clavicles. No deformities or crepitus.   Neurological: He is alert and oriented to person, place, and time. GCS eye subscore is 4. GCS verbal subscore is 5. GCS motor subscore is 6.  Follows commands, Moves all extremities  5/5 strength to BUE and BLE  Sensation intact throughout all major nerve distributions.  No gait abnormalities  Skin: Skin is warm and dry. No rash noted.  Psychiatric: He has a normal mood and affect. His speech is normal and behavior is normal.  Nursing note and vitals reviewed.    ED Treatments / Results  DIAGNOSTIC STUDIES: Oxygen Saturation is 99% on RA, normal by my interpretation.   COORDINATION OF CARE: 1:51 PM- Will prescribe muscle relaxer and NSAID. Will give resources to establish care with a PCP. Pt verbalizes understanding and agrees to plan.  Medications - No data to display  Labs (all labs ordered are listed, but only abnormal results are displayed) Labs Reviewed - No data to display  EKG  EKG Interpretation None       Radiology No results found.  Procedures Procedures (including critical care time)  Medications Ordered in ED Medications - No data to display   Initial Impression / Assessment and Plan / ED Course  I have reviewed the triage vital signs and the nursing notes.  Pertinent labs & imaging results that were available during my care of the patient were reviewed by me and considered in my medical decision making (see chart for  details).     26 yo M with back pain that began yesterday after lifting his son. Patient is afebrile, non-toxic appearing, sitting comfortably on examination table. Vital signs reviewed and stable.  No red flag symptoms.  No neurological deficits on exam. History/physical exam are not concerning for cauda equina or spinal abscess. No urinary symptoms suggestive of UTI. No rash concerning for shingles. No indications for imaging at this time given lack of trauma/injury and history/physical exam. Consider  musculoskeletal strain vs mechanical back pain. RICE protocol and pain medicine indicated and discussed with patient. Provided patient with a list of clinic resources to use if he does not have a PCP. Instructed to call them today to arrange follow-up in the next 24-48 hours. Return precautions discussed. Patient expresses understanding and agreement to plan.     Final Clinical Impressions(s) / ED Diagnoses   Final diagnoses:  Acute bilateral low back pain without sciatica  Muscle strain    New Prescriptions Discharge Medication List as of 11/01/2016  2:32 PM    START taking these medications   Details  methocarbamol (ROBAXIN)  500 MG tablet Take 1 tablet (500 mg total) by mouth 2 (two) times daily., Starting Sun 11/01/2016, Print        I personally performed the services described in this documentation, which was scribed in my presence. The recorded information has been reviewed and is accurate.     Maxwell Caul, PA-C 11/01/16 1438    Gerhard Munch, MD 11/01/16 1725

## 2016-11-01 NOTE — ED Notes (Signed)
C/o right upper back pain as well as pain across lower back since lifting child last pm.

## 2016-11-01 NOTE — Discharge Instructions (Addendum)
Follow-up with your primary care doctor in the next 24-48 hour for further evaluation.   If you do not have a primary care doctor, you can use one provided in the list below.   Take Robaxin as prescribed.   You can take Tylenol or Ibuprofen as directed for pain.  Return to the Emergency Department for: Fever  Leg weakness Urinary or bowel accidents  Abdominal pain Worsening or severe pain    Back Pain:   Your back pain should be treated with medicines such as ibuprofen or aleve and this back pain should get better over the next 2 weeks.  However if you develop severe or worsening pain, low back pain with fever, numbness, weakness or inability to walk or urinate, you should return to the ER immediately.    Please follow up with your doctor this week for a recheck if still having symptoms . Low back pain is discomfort in the lower back that may be due to injuries to muscles and ligaments around the spine.  Occasionally, it may be caused by a a problem to a part of the spine called a disc.  The pain may last several days or a week;  However, most patients get completely well in 4 weeks.  Self - care:  The application of heat can help soothe the pain.  Maintaining your daily activities, including walking, is encourged, as it will help you get better faster than just staying in bed.  Medications are also useful to help with pain control.  A commonly prescribed medications includes acetaminophen.  This medication is generally safe, though you should not take more than 8 of the extra strength (500mg ) pills a day.  Non steroidal anti inflammatory medications including Ibuprofen and naproxen;  These medications help both pain and swelling and are very useful in treating back pain.  They should be taken with food, as they can cause stomach upset, and more seriously, stomach bleeding.    Muscle relaxants:  These medications can help with muscle tightness that is a cause of lower back pain.  Most  of these medications can cause drowsiness, and it is not safe to drive or use dangerous machinery while taking them.    If you do not have a primary care doctor you see regularly, please you the list below. Please call them to arrange for follow-up.    No Primary Care Doctor Call [facility]  [phone] Other agencies that provide inexpensive medical care    [facility]  [phone]    [facility]  [phone]    [facility]  [phone]    [facility]  [phone]    [facility]  [phone]    [facility]  [phone]

## 2016-11-01 NOTE — ED Triage Notes (Signed)
Pt reports picking up his son last night and onset of lower and mid back pain. Ambulatory at triage.

## 2017-01-20 ENCOUNTER — Emergency Department (HOSPITAL_BASED_OUTPATIENT_CLINIC_OR_DEPARTMENT_OTHER)
Admission: EM | Admit: 2017-01-20 | Discharge: 2017-01-20 | Disposition: A | Discharge status: Home / Self Care | Payer: 59 | Attending: Emergency Medicine | Admitting: Emergency Medicine

## 2017-01-20 ENCOUNTER — Emergency Department (HOSPITAL_BASED_OUTPATIENT_CLINIC_OR_DEPARTMENT_OTHER): Payer: 59

## 2017-01-20 ENCOUNTER — Encounter (HOSPITAL_BASED_OUTPATIENT_CLINIC_OR_DEPARTMENT_OTHER): Payer: Self-pay | Admitting: *Deleted

## 2017-01-20 DIAGNOSIS — F17200 Nicotine dependence, unspecified, uncomplicated: Secondary | ICD-10-CM | POA: Insufficient documentation

## 2017-01-20 DIAGNOSIS — Z79899 Other long term (current) drug therapy: Secondary | ICD-10-CM | POA: Insufficient documentation

## 2017-01-20 DIAGNOSIS — R0609 Other forms of dyspnea: Secondary | ICD-10-CM | POA: Diagnosis present

## 2017-01-20 DIAGNOSIS — J069 Acute upper respiratory infection, unspecified: Secondary | ICD-10-CM | POA: Insufficient documentation

## 2017-01-20 MED ORDER — PREDNISONE 50 MG PO TABS
50.0000 mg | ORAL_TABLET | Freq: Once | ORAL | Status: AC
  Administered 2017-01-20: 50 mg via ORAL
  Filled 2017-01-20: qty 1

## 2017-01-20 MED ORDER — PREDNISONE 50 MG PO TABS: 50.0000 mg | ORAL_TABLET | Freq: Every day | ORAL | 0 refills | Status: DC

## 2017-01-20 NOTE — ED Provider Notes (Signed)
[facility] Provider Note   CSN: [id number] Arrival date & time: 01/20/17  1529     History   Chief Complaint Chief Complaint  Patient presents with  . URI    HPI Alejandro Young is a 26 y.o. male presenting with 1 week of URI symptoms and 2 days of DOE at work. He was given an inhaler by work doctor and a z-pack. He still has DOE at work today despite inhaler use. He is coughing up clear mucous. He reports chills, unsure if he has a fever. Has body aches as well that come and go. No nausea, vomiting,   Tobacco user 1/2 pack per day and occasional marijuana use, no IV drug use, rare alcohol.   HPI  History reviewed. No pertinent past medical history.  There are no active problems to display for this patient.   History reviewed. No pertinent surgical history.   Home Medications    Prior to Admission medications   Medication Sig Start Date End Date Taking? Authorizing Provider  albuterol (PROVENTIL HFA;VENTOLIN HFA) 108 (90 Base) MCG/ACT inhaler Inhale into the lungs every 6 (six) hours as needed for wheezing or shortness of breath.   Yes [provider]  azithromycin (ZITHROMAX) 500 MG tablet Take by mouth daily.   Yes [provider]  Cetirizine HCl (ZYRTEC ALLERGY) 10 MG CAPS Take 1 capsule (10 mg total) by mouth at bedtime. 05/11/14   Young, Courtney, PA-C  guaiFENesin (ROBITUSSIN) 100 MG/5ML SOLN Take 5 mLs (100 mg total) by mouth every 4 (four) hours as needed for cough or to loosen phlegm. 05/11/14   Young, Courtney, PA-C  ibuprofen (ADVIL,MOTRIN) 600 MG tablet Take 1 tablet (600 mg total) by mouth every 6 (six) hours as needed. 11/01/16   Alejandro Caul, PA-C  lidocaine (XYLOCAINE) 2 % solution Use as directed 20 mLs in the mouth or throat as needed for mouth pain. 05/11/14   Young, Courtney, PA-C  methocarbamol (ROBAXIN) 500 MG tablet Take 1 tablet (500 mg total) by mouth 2 (two) times daily. 11/01/16   Alejandro Caul, PA-C    predniSONE (DELTASONE) 50 MG tablet Take 1 tablet (50 mg total) by mouth daily with breakfast. 01/20/17   Tillman Sers, DO  promethazine (PHENERGAN) 25 MG tablet Take 1 tablet (25 mg total) by mouth every 6 (six) hours as needed for nausea or vomiting. 12/21/13   Alejandro Silk, PA-C    Family History History reviewed. No pertinent family history.  Social History Social History  Substance Use Topics  . Smoking status: Current Every Day Smoker    Packs/day: 1.00  . Smokeless tobacco: Never Used  . Alcohol use No     Allergies   Amoxicillin and Penicillins   Review of Systems Review of Systems  Constitutional: Positive for chills. Negative for activity change, appetite change and fever.  HENT: Positive for congestion and sore throat. Negative for rhinorrhea.   Eyes: Negative for pain and discharge.  Respiratory: Positive for cough, chest tightness and shortness of breath. Negative for wheezing.   Cardiovascular: Negative for chest pain and palpitations.  Gastrointestinal: Negative for abdominal pain, constipation, diarrhea, nausea and vomiting.  Genitourinary: Negative for difficulty urinating, dysuria and frequency.  Musculoskeletal: Positive for myalgias. Negative for arthralgias.  Skin: Negative for pallor, rash and wound.  Neurological: Negative for seizures, weakness, numbness and headaches.     Physical Exam Updated Vital Signs Ht  (1.778 m)   Wt 95.3 kg (210 lb)  BMI 30.13 kg/m   Physical Exam  Constitutional: He is oriented to person, place, and time. He appears well-developed and well-nourished. No distress.  HENT:  Head: Normocephalic and atraumatic.  Mouth/Throat: Oropharynx is clear and moist. No oropharyngeal exudate.  Eyes: Pupils are equal, round, and reactive to light. EOM are normal. Right eye exhibits no discharge. Left eye exhibits no discharge.  Neck: Normal range of motion. Neck supple.  Cardiovascular: Normal rate and regular rhythm.    No murmur heard. Pulmonary/Chest: Effort normal and breath sounds normal. No respiratory distress. He has no wheezes. He has no rales.  Abdominal: Soft. He exhibits no distension. There is no tenderness.  Musculoskeletal: Normal range of motion. He exhibits no deformity.  Lymphadenopathy:    He has no cervical adenopathy.  Neurological: He is alert and oriented to person, place, and time. He exhibits normal muscle tone.  Skin: Skin is warm and dry. No rash noted.  Psychiatric: He has a normal mood and affect. His behavior is normal. Judgment and thought content normal.  Nursing note and vitals reviewed.    ED Treatments / Results  Labs (all labs ordered are listed, but only abnormal results are displayed) Labs Reviewed - No data to display  EKG  EKG Interpretation None       Radiology Dg Chest 2 View  Result Date: 01/20/2017 CLINICAL DATA:  Cough, shortness of breath, and chills for 4 days. EXAM: CHEST  2 VIEW COMPARISON:  None. FINDINGS: The heart size and mediastinal contours are within normal limits. Both lungs are clear. The visualized skeletal structures are unremarkable. IMPRESSION: Negative.  No active cardiopulmonary disease. Electronically Signed   By: Myles Rosenthal M.D.   On: 01/20/2017 17:41    Procedures Procedures (including critical care time)  Medications Ordered in ED Medications  predniSONE (DELTASONE) tablet 50 mg (50 mg Oral Given 01/20/17 1803)     Initial Impression / Assessment and Plan / ED Course  I have reviewed the triage vital signs and the nursing notes.  Pertinent labs & imaging results that were available during my care of the patient were reviewed by me and considered in my medical decision making (see chart for details).     26 year old male presenting with 1 week of URI symptoms and 2 days of DOE while at work. No improvement with albuterol inhaler. On day 2 of z-pack. Well appearing with benign exam. CXR without acute cardiopulmonary  findings. Given 50 mg prednisone in ED and prescription to complete 5 day burst of steroids. Instructed tylenol and ibuprofen for fever or pain. Complete course of Azithromycin. Follow up with PCP if symptoms worsen or fail to improve. Stable for discharge home. Patient verbalized understanding and agreement with plan.  Final Clinical Impressions(s) / ED Diagnoses   Final diagnoses:  Upper respiratory tract infection, unspecified type    New Prescriptions Discharge Medication List as of 01/20/2017  5:59 PM    START taking these medications   Details  predniSONE (DELTASONE) 50 MG tablet Take 1 tablet (50 mg total) by mouth daily with breakfast., Starting Wed 01/20/2017, Print         Tillman Sers, DO 01/20/17 1933    Rolland Porter, MD 02/01/17 2210

## 2017-01-20 NOTE — ED Triage Notes (Signed)
Pt c/o URi Symptoms  x 3 days

## 2017-01-20 NOTE — ED Notes (Signed)
ED Provider at bedside. 

## 2017-01-20 NOTE — ED Notes (Signed)
Patient transported to X-ray 

## 2018-02-14 ENCOUNTER — Emergency Department (HOSPITAL_COMMUNITY): Payer: Self-pay

## 2018-02-14 ENCOUNTER — Encounter (HOSPITAL_COMMUNITY): Payer: Self-pay | Admitting: Emergency Medicine

## 2018-02-14 ENCOUNTER — Other Ambulatory Visit: Payer: Self-pay

## 2018-02-14 ENCOUNTER — Emergency Department (HOSPITAL_COMMUNITY)
Admission: EM | Admit: 2018-02-14 | Discharge: 2018-02-14 | Disposition: A | Discharge status: Home / Self Care | Payer: Self-pay | Attending: Emergency Medicine | Admitting: Emergency Medicine

## 2018-02-14 DIAGNOSIS — Z79899 Other long term (current) drug therapy: Secondary | ICD-10-CM | POA: Insufficient documentation

## 2018-02-14 DIAGNOSIS — J988 Other specified respiratory disorders: Secondary | ICD-10-CM

## 2018-02-14 DIAGNOSIS — J069 Acute upper respiratory infection, unspecified: Secondary | ICD-10-CM | POA: Insufficient documentation

## 2018-02-14 DIAGNOSIS — F17210 Nicotine dependence, cigarettes, uncomplicated: Secondary | ICD-10-CM | POA: Insufficient documentation

## 2018-02-14 LAB — CBC
HEMATOCRIT: 48.4 % (ref 39.0–52.0)
Hemoglobin: 15.7 g/dL (ref 13.0–17.0)
MCH: 28.1 pg (ref 26.0–34.0)
MCHC: 32.4 g/dL (ref 30.0–36.0)
MCV: 86.6 fL (ref 80.0–100.0)
Platelets: 330 10*3/uL (ref 150–400)
RBC: 5.59 MIL/uL (ref 4.22–5.81)
RDW: 13.2 % (ref 11.5–15.5)
WBC: 7.7 10*3/uL (ref 4.0–10.5)
nRBC: 0 % (ref 0.0–0.2)

## 2018-02-14 LAB — BASIC METABOLIC PANEL
Anion gap: 10 (ref 5–15)
BUN: 9 mg/dL (ref 6–20)
CO2: 18 mmol/L — AB (ref 22–32)
CREATININE: 1.03 mg/dL (ref 0.61–1.24)
Calcium: 9 mg/dL (ref 8.9–10.3)
Chloride: 111 mmol/L (ref 98–111)
GFR calc Af Amer: >60 mL/min (ref >60)
GFR calc non Af Amer: >60 mL/min (ref >60)
Glucose, Bld: 99 mg/dL (ref 70–99)
Potassium: 4.4 mmol/L (ref 3.5–5.1)
SODIUM: 139 mmol/L (ref 135–145)

## 2018-02-14 LAB — I-STAT TROPONIN, ED: TROPONIN I, POC: 0 ng/mL (ref 0.00–0.08)

## 2018-02-14 MED ORDER — AZITHROMYCIN 250 MG PO TABS: 250.0000 mg | ORAL_TABLET | Freq: Every day | ORAL | 0 refills | Status: DC

## 2018-02-14 MED ORDER — BENZONATATE 100 MG PO CAPS: 100.0000 mg | ORAL_CAPSULE | Freq: Three times a day (TID) | ORAL | 0 refills | Status: DC

## 2018-02-14 NOTE — Discharge Instructions (Addendum)
Please read attached information. If you experience any new or worsening signs or symptoms please return to the emergency room for evaluation. Please follow-up with your primary care provider or specialist as discussed. Please use medication prescribed only as directed and discontinue taking if you have any concerning signs or symptoms.   °

## 2018-02-14 NOTE — ED Notes (Signed)
ED Provider at bedside. 

## 2018-02-14 NOTE — ED Triage Notes (Signed)
Pt c.o. Left chest pain with shortness of breath since last night. Pt also appears to have some congestion. Pt is a smoker.

## 2018-02-14 NOTE — ED Provider Notes (Signed)
[facility] EMERGENCY DEPARTMENT Provider Note   CSN: [id number] Arrival date & time: 02/14/18  1034     History   Chief Complaint Chief Complaint  Patient presents with  . Chest Pain  . Shortness of Breath    HPI Alejandro Young is a 27 y.o. male.  HPI    27 year old male presents today with complaints of upper respiratory infection.  Patient notes a one-week history of cough, nasal congestion.  Patient notes that he developed some minor shortness of breath yesterday and anterior chest pain today.  He notes the pain started this morning feeling like pressure worse with coughing.  Patient notes he has been taking cough and cold medication at home without significant improvement in his symptoms.  Patient denies any fever.  He reports he is a smoker, no asthma history.  Pt denies any cardiac history.   History reviewed. No pertinent past medical history.  There are no active problems to display for this patient.   History reviewed. No pertinent surgical history.      Home Medications    Prior to Admission medications   Medication Sig Start Date End Date Taking? Authorizing Provider  albuterol (PROVENTIL HFA;VENTOLIN HFA) 108 (90 Base) MCG/ACT inhaler Inhale into the lungs every 6 (six) hours as needed for wheezing or shortness of breath.    [provider]  azithromycin (ZITHROMAX) 250 MG tablet Take 1 tablet (250 mg total) by mouth daily. Take first 2 tablets together, then 1 every day until finished. 02/14/18   Dunya Meiners, Tinnie Gens, PA-C  benzonatate (TESSALON) 100 MG capsule Take 1 capsule (100 mg total) by mouth every 8 (eight) hours. 02/14/18   Ahmarion Saraceno, Tinnie Gens, PA-C  Cetirizine HCl (ZYRTEC ALLERGY) 10 MG CAPS Take 1 capsule (10 mg total) by mouth at bedtime. 05/11/14   Forcucci, Courtney, PA-C  guaiFENesin (ROBITUSSIN) 100 MG/5ML SOLN Take 5 mLs (100 mg total) by mouth every 4 (four) hours as needed for cough or to loosen phlegm. 05/11/14    Forcucci, Courtney, PA-C  ibuprofen (ADVIL,MOTRIN) 600 MG tablet Take 1 tablet (600 mg total) by mouth every 6 (six) hours as needed. 11/01/16   Maxwell Caul, PA-C  lidocaine (XYLOCAINE) 2 % solution Use as directed 20 mLs in the mouth or throat as needed for mouth pain. 05/11/14   Forcucci, Courtney, PA-C  methocarbamol (ROBAXIN) 500 MG tablet Take 1 tablet (500 mg total) by mouth 2 (two) times daily. 11/01/16   Maxwell Caul, PA-C  predniSONE (DELTASONE) 50 MG tablet Take 1 tablet (50 mg total) by mouth daily with breakfast. 01/20/17   Tillman Sers, DO  promethazine (PHENERGAN) 25 MG tablet Take 1 tablet (25 mg total) by mouth every 6 (six) hours as needed for nausea or vomiting. 12/21/13   Junious Silk, PA-C    Family History No family history on file.  Social History Social History   Tobacco Use  . Smoking status: Current Every Day Smoker    Packs/day: 1.00  . Smokeless tobacco: Never Used  Substance Use Topics  . Alcohol use: No  . Drug use: Yes    Types: Marijuana     Allergies   Amoxicillin and Penicillins   Review of Systems Review of Systems  All other systems reviewed and are negative.   Physical Exam Updated Vital Signs BP 138/89   Pulse 62   Temp 98.7 F (37.1 C)   Resp 18   SpO2 98%   Physical Exam  Constitutional: He  is oriented to person, place, and time. He appears well-developed and well-nourished.  HENT:  Head: Normocephalic and atraumatic.  Oropharynx clear no swelling or edema  Eyes: Pupils are equal, round, and reactive to light. Conjunctivae are normal. Right eye exhibits no discharge. Left eye exhibits no discharge. No scleral icterus.  Neck: Normal range of motion. No JVD present. No tracheal deviation present.  Cardiovascular: Normal rate, regular rhythm, normal heart sounds and intact distal pulses. Exam reveals no gallop and no friction rub.  No murmur heard. Pulmonary/Chest: Effort normal and breath sounds normal. No stridor. No  respiratory distress. He has no wheezes. He has no rales. He exhibits no tenderness.  Neurological: He is alert and oriented to person, place, and time. Coordination normal.  Psychiatric: He has a normal mood and affect. His behavior is normal. Judgment and thought content normal.  Nursing note and vitals reviewed.   ED Treatments / Results  Labs (all labs ordered are listed, but only abnormal results are displayed) Labs Reviewed  BASIC METABOLIC PANEL - Abnormal; Notable for the following components:      Result Value   CO2 18 (*)    All other components within normal limits  CBC  I-STAT TROPONIN, ED    EKG None  Radiology Dg Chest 2 View  Result Date: 02/14/2018 CLINICAL DATA:  Chest pain, shortness of breath EXAM: CHEST - 2 VIEW COMPARISON:  01/20/2017 FINDINGS: Heart and mediastinal contours are within normal limits. No focal opacities or effusions. No acute bony abnormality. IMPRESSION: No active cardiopulmonary disease. Electronically Signed   By: Charlett Nose M.D.   On: 02/14/2018 11:01    Procedures Procedures (including critical care time)  Medications Ordered in ED Medications - No data to display   Initial Impression / Assessment and Plan / ED Course  I have reviewed the triage vital signs and the nursing notes.  Pertinent labs & imaging results that were available during my care of the patient were reviewed by me and considered in my medical decision making (see chart for details).     Labs: I-STAT troponin, BMP, CBC  Imaging: Dg chest 2 view  Consults:  Therapeutics:  Discharge Meds: Azithromycin  Assessment/Plan:  8 YOF presents today with likely upper respiratory infection.  Patient has had 1 week of symptoms with reported worsening, I feel at this time it is appropriate to start him on antibiotics.  His lung sounds are clear, he will be discharged with strict return precautions, he verbalized understanding and agreement to today's  plan.    Final Clinical Impressions(s) / ED Diagnoses   Final diagnoses:  Respiratory infection    ED Discharge Orders         Ordered    azithromycin (ZITHROMAX) 250 MG tablet  Daily     02/14/18 1300    benzonatate (TESSALON) 100 MG capsule  Every 8 hours     02/14/18 1303           Eyvonne Mechanic, PA-C 02/14/18 1436    Alvira Monday, MD 02/15/18 2228

## 2018-04-15 ENCOUNTER — Emergency Department (HOSPITAL_COMMUNITY)
Admission: EM | Admit: 2018-04-15 | Discharge: 2018-04-15 | Disposition: A | Discharge status: Home / Self Care | Payer: Self-pay | Attending: Emergency Medicine | Admitting: Emergency Medicine

## 2018-04-15 ENCOUNTER — Encounter (HOSPITAL_COMMUNITY): Payer: Self-pay

## 2018-04-15 ENCOUNTER — Other Ambulatory Visit: Payer: Self-pay

## 2018-04-15 ENCOUNTER — Emergency Department (HOSPITAL_COMMUNITY): Payer: Self-pay

## 2018-04-15 DIAGNOSIS — F17200 Nicotine dependence, unspecified, uncomplicated: Secondary | ICD-10-CM | POA: Insufficient documentation

## 2018-04-15 DIAGNOSIS — J40 Bronchitis, not specified as acute or chronic: Secondary | ICD-10-CM | POA: Insufficient documentation

## 2018-04-15 DIAGNOSIS — Z79899 Other long term (current) drug therapy: Secondary | ICD-10-CM | POA: Insufficient documentation

## 2018-04-15 MED ORDER — ALBUTEROL SULFATE HFA 108 (90 BASE) MCG/ACT IN AERS
2.0000 | INHALATION_SPRAY | Freq: Once | RESPIRATORY_TRACT | Status: AC
  Administered 2018-04-15: 2 via RESPIRATORY_TRACT
  Filled 2018-04-15: qty 6.7

## 2018-04-15 MED ORDER — IPRATROPIUM-ALBUTEROL 0.5-2.5 (3) MG/3ML IN SOLN
3.0000 mL | Freq: Once | RESPIRATORY_TRACT | Status: AC
  Administered 2018-04-15: 3 mL via RESPIRATORY_TRACT
  Filled 2018-04-15: qty 3

## 2018-04-15 MED ORDER — PREDNISONE 20 MG PO TABS: 40.0000 mg | ORAL_TABLET | Freq: Every day | ORAL | 0 refills | Status: DC

## 2018-04-15 MED ORDER — GUAIFENESIN-CODEINE 100-10 MG/5ML PO SYRP: 10.0000 mL | ORAL_SOLUTION | Freq: Three times a day (TID) | ORAL | 0 refills | Status: DC | PRN

## 2018-04-15 NOTE — ED Triage Notes (Signed)
Pt reports cough for 3 days. Body aches and Sore throat. Chest and nasal congestion

## 2018-04-15 NOTE — Discharge Instructions (Addendum)
1-2 puffs of the inhaler every 4-6 hrs as needed.  Take the medication as directed.  Follow-up with your doctor for recheck

## 2018-04-16 NOTE — ED Provider Notes (Signed)
[facility] EMERGENCY DEPARTMENT Provider Note   CSN: [id number] Arrival date & time: 04/15/18  1211     History   Chief Complaint Chief Complaint  Patient presents with  . Cough    HPI Alejandro Young is a 27 y.o. male.  HPI  Alejandro Young is a 27 y.o. male who presents to the Emergency Department complaining of cough and nasal congestion.  Symptoms present for 3 days.  Symptoms are associated with sore throat and generalized body aches.  No fever.  Cough described as occasionally productive.  No chest pain or shortness of breath.  No recent sick contacts.  Nothing makes his symptoms better or worse.    History reviewed. No pertinent past medical history.  There are no active problems to display for this patient.   History reviewed. No pertinent surgical history.    Home Medications    Prior to Admission medications   Medication Sig Start Date End Date Taking? Authorizing Provider  albuterol (PROVENTIL HFA;VENTOLIN HFA) 108 (90 Base) MCG/ACT inhaler Inhale into the lungs every 6 (six) hours as needed for wheezing or shortness of breath.    [provider]  azithromycin (ZITHROMAX) 250 MG tablet Take 1 tablet (250 mg total) by mouth daily. Take first 2 tablets together, then 1 every day until finished. 02/14/18   Hedges, Tinnie Gens, PA-C  benzonatate (TESSALON) 100 MG capsule Take 1 capsule (100 mg total) by mouth every 8 (eight) hours. 02/14/18   Hedges, Tinnie Gens, PA-C  Cetirizine HCl (ZYRTEC ALLERGY) 10 MG CAPS Take 1 capsule (10 mg total) by mouth at bedtime. 05/11/14   Shirleen Schirmer, PA-C  guaiFENesin (ROBITUSSIN) 100 MG/5ML SOLN Take 5 mLs (100 mg total) by mouth every 4 (four) hours as needed for cough or to loosen phlegm. 05/11/14   Shirleen Schirmer, PA-C  guaiFENesin-codeine (ROBITUSSIN AC) 100-10 MG/5ML syrup Take 10 mLs by mouth 3 (three) times daily as needed. 04/15/18   Derold Dorsch, PA-C  ibuprofen (ADVIL,MOTRIN) 600 MG tablet Take 1 tablet (600 mg  total) by mouth every 6 (six) hours as needed. 11/01/16   Maxwell Caul, PA-C  lidocaine (XYLOCAINE) 2 % solution Use as directed 20 mLs in the mouth or throat as needed for mouth pain. 05/11/14   Shirleen Schirmer, PA-C  methocarbamol (ROBAXIN) 500 MG tablet Take 1 tablet (500 mg total) by mouth 2 (two) times daily. 11/01/16   Maxwell Caul, PA-C  predniSONE (DELTASONE) 20 MG tablet Take 2 tablets (40 mg total) by mouth daily. 04/15/18   Jaeleah Smyser, PA-C  promethazine (PHENERGAN) 25 MG tablet Take 1 tablet (25 mg total) by mouth every 6 (six) hours as needed for nausea or vomiting. 12/21/13   Junious Silk, PA-C    Family History No family history on file.  Social History Social History   Tobacco Use  . Smoking status: Current Every Day Smoker    Packs/day: 1.00  . Smokeless tobacco: Never Used  Substance Use Topics  . Alcohol use: No  . Drug use: Yes    Types: Marijuana     Allergies   Amoxicillin and Penicillins   Review of Systems Review of Systems  Constitutional: Negative for appetite change, chills and fever.  HENT: Positive for congestion and sore throat. Negative for trouble swallowing.   Respiratory: Positive for cough and chest tightness. Negative for shortness of breath and wheezing.   Cardiovascular: Negative for chest pain.  Gastrointestinal: Negative for abdominal pain, nausea and vomiting.  Genitourinary: Negative for  dysuria.  Musculoskeletal: Positive for myalgias. Negative for arthralgias, neck pain and neck stiffness.  Skin: Negative for rash.  Neurological: Negative for dizziness, weakness, numbness and headaches.  Hematological: Negative for adenopathy.     Physical Exam Updated Vital Signs BP (!) 145/80 (BP Location: Right Arm)   Pulse 88   Temp 98.3 F (36.8 C) (Oral)   Resp 17   Ht 5\' 10"  (1.778 m)   Wt 99.8 kg   SpO2 99%   BMI 31.57 kg/m   Physical Exam Vitals signs and nursing note reviewed.  Constitutional:      General: He  is not in acute distress.    Appearance: He is well-developed. He is not ill-appearing.  HENT:     Head: Normocephalic.     Jaw: No trismus.     Right Ear: Tympanic membrane and ear canal normal.     Left Ear: Tympanic membrane and ear canal normal.     Nose: Mucosal edema present. No rhinorrhea.     Mouth/Throat:     Pharynx: Uvula midline. Posterior oropharyngeal erythema present. No oropharyngeal exudate or uvula swelling.     Tonsils: No tonsillar abscesses.  Eyes:     Conjunctiva/sclera: Conjunctivae normal.  Neck:     Musculoskeletal: Normal range of motion and neck supple.     Trachea: Phonation normal.     Meningeal: Brudzinski's sign and Kernig's sign absent.  Cardiovascular:     Rate and Rhythm: Normal rate and regular rhythm.     Pulses: Normal pulses.     Heart sounds: No murmur.  Pulmonary:     Effort: Pulmonary effort is normal. No respiratory distress.     Breath sounds: Wheezing present. No rales.     Comments: Mild expiratory wheezes.  No rales. Abdominal:     General: There is no distension.     Palpations: Abdomen is soft.     Tenderness: There is no abdominal tenderness. There is no guarding or rebound.  Musculoskeletal: Normal range of motion.        General: No swelling.  Lymphadenopathy:     Cervical: No cervical adenopathy.  Skin:    General: Skin is warm and dry.     Capillary Refill: Capillary refill takes less than 2 seconds.  Neurological:     General: No focal deficit present.     Mental Status: He is alert. Mental status is at baseline.     Sensory: No sensory deficit.     Motor: No abnormal muscle tone.  Psychiatric:        Mood and Affect: Mood normal.      ED Treatments / Results  Labs (all labs ordered are listed, but only abnormal results are displayed) Labs Reviewed - No data to display  EKG None  Radiology Dg Chest 2 View  Result Date: 04/15/2018 CLINICAL DATA:  Cough EXAM: CHEST - 2 VIEW COMPARISON:  02/14/2018 FINDINGS:  The heart size and mediastinal contours are within normal limits. Both lungs are clear. The visualized skeletal structures are unremarkable. IMPRESSION: No active cardiopulmonary disease. Electronically Signed   By: Deatra Robinson M.D.   On: 04/15/2018 14:41    Procedures Procedures (including critical care time)  Medications Ordered in ED Medications  ipratropium-albuterol (DUONEB) 0.5-2.5 (3) MG/3ML nebulizer solution 3 mL (3 mLs Nebulization Given 04/15/18 1417)  albuterol (PROVENTIL HFA;VENTOLIN HFA) 108 (90 Base) MCG/ACT inhaler 2 puff (2 puffs Inhalation Given 04/15/18 1421)     Initial Impression / Assessment and Plan /  ED Course  I have reviewed the triage vital signs and the nursing notes.  Pertinent labs & imaging results that were available during my care of the patient were reviewed by me and considered in my medical decision making (see chart for details).     On recheck after neb, lung sounds improved.  VSS.  Pt reports feeling better.  Albuterol MDI dispensed.  rx for anti-tussive and prednisone.    Final Clinical Impressions(s) / ED Diagnoses   Final diagnoses:  Bronchitis    ED Discharge Orders         Ordered    predniSONE (DELTASONE) 20 MG tablet  Daily     04/15/18 1506    guaiFENesin-codeine (ROBITUSSIN AC) 100-10 MG/5ML syrup  3 times daily PRN     04/15/18 1506           Pauline Ausriplett, Damek Ende, PA-C 04/16/18 2313    Vanetta MuldersZackowski, Scott, MD 04/20/18 1057

## 2018-06-26 IMAGING — CR DG CHEST 2V
2 series · 2 of 2 positions shown · non-contrast
Comparison: None.

CLINICAL DATA: Cough, shortness of breath, and chills for 4 days.

EXAM:
CHEST  2 VIEW

[w chest pa]
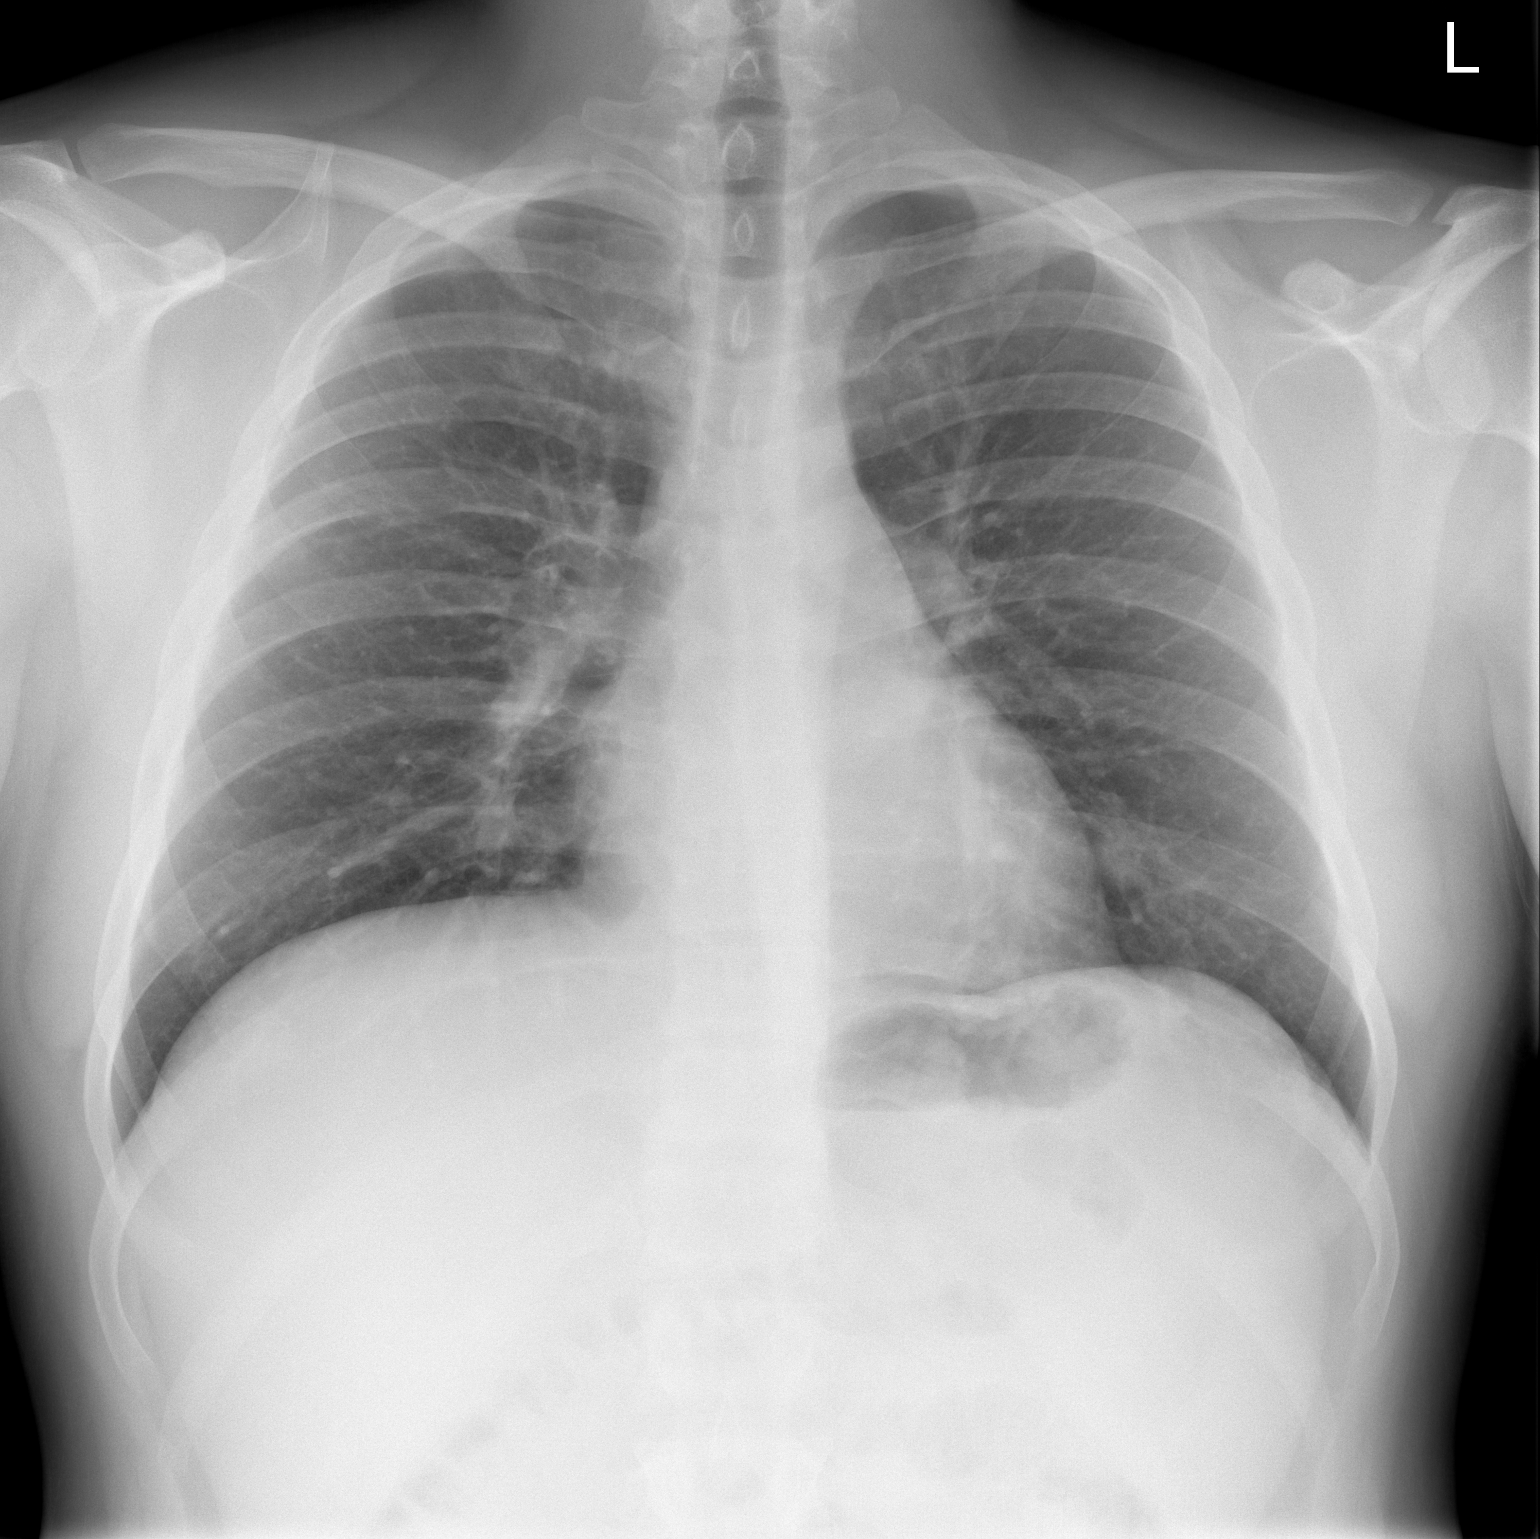

[w chest lat]
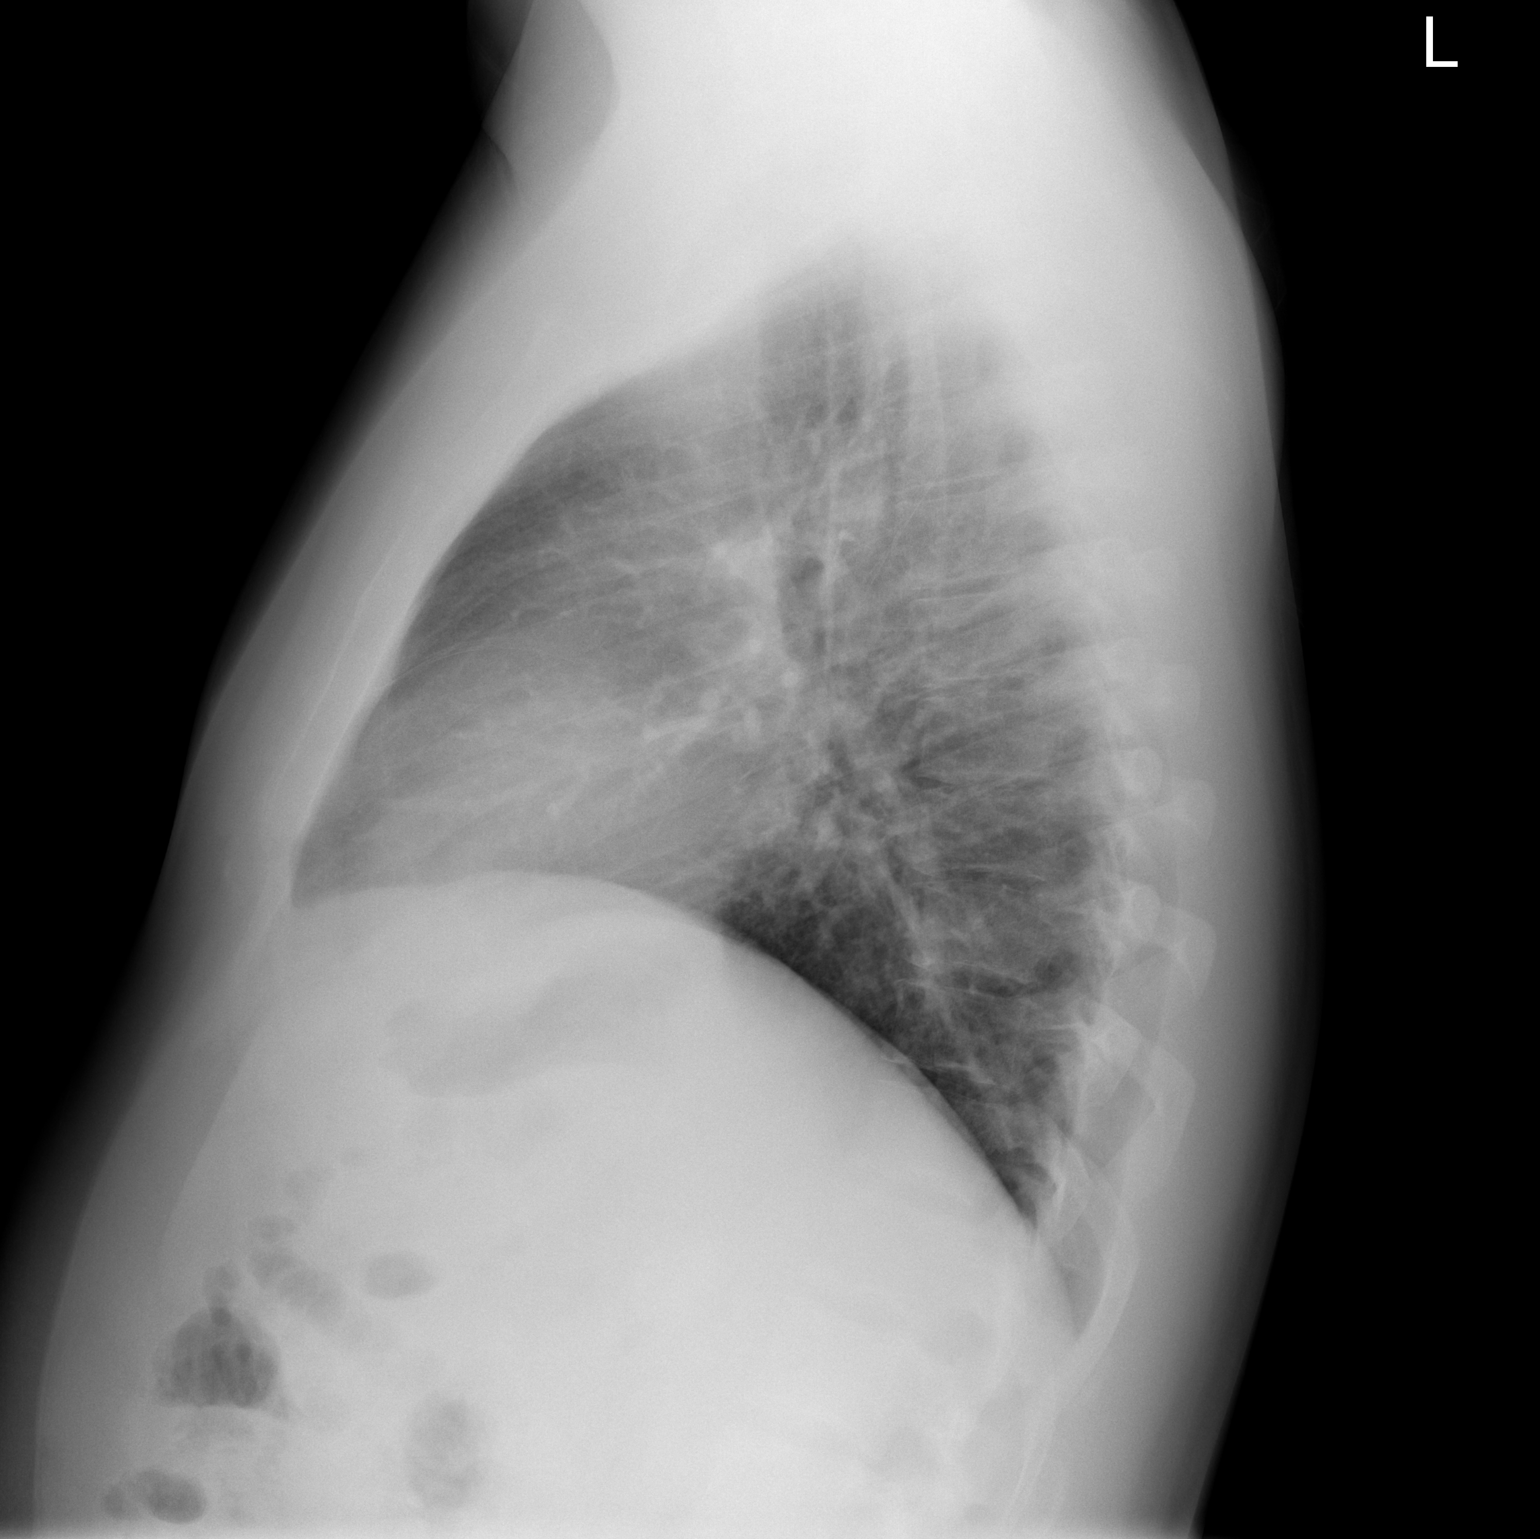

[2 of 2 positions shown; findings below may reference images not displayed]

FINDINGS: The heart size and mediastinal contours are within normal limits.
Both lungs are clear. The visualized skeletal structures are
unremarkable.
IMPRESSION: Negative.  No active cardiopulmonary disease.

## 2018-07-15 ENCOUNTER — Encounter (HOSPITAL_COMMUNITY): Payer: Self-pay | Admitting: *Deleted

## 2018-07-15 ENCOUNTER — Emergency Department (HOSPITAL_COMMUNITY): Payer: Self-pay

## 2018-07-15 ENCOUNTER — Other Ambulatory Visit: Payer: Self-pay

## 2018-07-15 ENCOUNTER — Emergency Department (HOSPITAL_COMMUNITY)
Admission: EM | Admit: 2018-07-15 | Discharge: 2018-07-15 | Disposition: A | Discharge status: Home / Self Care | Payer: Self-pay | Attending: Emergency Medicine | Admitting: Emergency Medicine

## 2018-07-15 DIAGNOSIS — Z79899 Other long term (current) drug therapy: Secondary | ICD-10-CM | POA: Insufficient documentation

## 2018-07-15 DIAGNOSIS — R0602 Shortness of breath: Secondary | ICD-10-CM | POA: Insufficient documentation

## 2018-07-15 DIAGNOSIS — F17200 Nicotine dependence, unspecified, uncomplicated: Secondary | ICD-10-CM | POA: Insufficient documentation

## 2018-07-15 MED ORDER — PREDNISONE 20 MG PO TABS: 40.0000 mg | ORAL_TABLET | Freq: Every day | ORAL | 0 refills | Status: DC

## 2018-07-15 MED ORDER — GUAIFENESIN-CODEINE 100-10 MG/5ML PO SYRP: 10.0000 mL | ORAL_SOLUTION | Freq: Three times a day (TID) | ORAL | 0 refills | Status: DC | PRN

## 2018-07-15 NOTE — ED Provider Notes (Signed)
[facility] EMERGENCY DEPARTMENT Provider Note   CSN: [id number] Arrival date & time: 07/15/18  9211    History   Chief Complaint Chief Complaint  Patient presents with  . Shortness of Breath    HPI Alejandro Young is a 28 y.o. male.     The history is provided by the patient and medical records. No language interpreter was used.  Shortness of Breath     28 year old male with history of tobacco use presenting for evaluation of shortness of breath.  Patient report he woke up this morning feeling out of breath.  He has an inhaler and try it once with mild improvement.  Shortness of breath has been persistent.  He does not complain of any fever chills, no runny nose sneezing coughing sore throat or ear pain.  He denies any pleuritic chest pain, nausea vomiting diarrhea.  He denies history of asthma or COPD.  He denies any prior history of PE or DVT, no recent surgery, prolonged bedrest, active cancer, hemoptysis, leg swelling or calf pain.  He does admits to smoking approximately 1/2 to 1 pack a day.  He mentioned having several similar episodes in the past with shortness of breath and was diagnosed with bronchitis.  History reviewed. No pertinent past medical history.  There are no active problems to display for this patient.   Past Surgical History:  Procedure Laterality Date  . WISDOM TOOTH EXTRACTION          Home Medications    Prior to Admission medications   Medication Sig Start Date End Date Taking? Authorizing Provider  albuterol (PROVENTIL HFA;VENTOLIN HFA) 108 (90 Base) MCG/ACT inhaler Inhale into the lungs every 6 (six) hours as needed for wheezing or shortness of breath.    [provider]  azithromycin (ZITHROMAX) 250 MG tablet Take 1 tablet (250 mg total) by mouth daily. Take first 2 tablets together, then 1 every day until finished. 02/14/18   Hedges, Tinnie Gens, PA-C  benzonatate (TESSALON) 100 MG capsule Take 1 capsule (100 mg  total) by mouth every 8 (eight) hours. 02/14/18   Hedges, Tinnie Gens, PA-C  Cetirizine HCl (ZYRTEC ALLERGY) 10 MG CAPS Take 1 capsule (10 mg total) by mouth at bedtime. 05/11/14   Shirleen Schirmer, PA-C  guaiFENesin (ROBITUSSIN) 100 MG/5ML SOLN Take 5 mLs (100 mg total) by mouth every 4 (four) hours as needed for cough or to loosen phlegm. 05/11/14   Shirleen Schirmer, PA-C  guaiFENesin-codeine (ROBITUSSIN AC) 100-10 MG/5ML syrup Take 10 mLs by mouth 3 (three) times daily as needed. 04/15/18   Triplett, Tammy, PA-C  ibuprofen (ADVIL,MOTRIN) 600 MG tablet Take 1 tablet (600 mg total) by mouth every 6 (six) hours as needed. 11/01/16   Maxwell Caul, PA-C  lidocaine (XYLOCAINE) 2 % solution Use as directed 20 mLs in the mouth or throat as needed for mouth pain. 05/11/14   Shirleen Schirmer, PA-C  methocarbamol (ROBAXIN) 500 MG tablet Take 1 tablet (500 mg total) by mouth 2 (two) times daily. 11/01/16   Maxwell Caul, PA-C  predniSONE (DELTASONE) 20 MG tablet Take 2 tablets (40 mg total) by mouth daily. 04/15/18   Triplett, Tammy, PA-C  promethazine (PHENERGAN) 25 MG tablet Take 1 tablet (25 mg total) by mouth every 6 (six) hours as needed for nausea or vomiting. 12/21/13   Junious Silk, PA-C    Family History No family history on file.  Social History Social History   Tobacco Use  . Smoking status: Current Every  Day Smoker    Packs/day: 1.00  . Smokeless tobacco: Never Used  Substance Use Topics  . Alcohol use: No  . Drug use: Yes    Types: Marijuana     Allergies   Amoxicillin and Penicillins   Review of Systems Review of Systems  Respiratory: Positive for shortness of breath.   All other systems reviewed and are negative.    Physical Exam Updated Vital Signs BP 133/77 (BP Location: Right Arm)   Pulse 98   Temp 98 F (36.7 C) (Oral)   Resp 18   Ht 5\' 10"  (1.778 m)   Wt 104.3 kg   SpO2 98%   BMI 33.00 kg/m   Physical Exam Vitals signs and nursing note reviewed.   Constitutional:      General: He is not in acute distress.    Appearance: He is well-developed.  HENT:     Head: Atraumatic.     Mouth/Throat:     Mouth: Mucous membranes are moist.  Eyes:     Conjunctiva/sclera: Conjunctivae normal.  Neck:     Musculoskeletal: Neck supple.  Cardiovascular:     Rate and Rhythm: Regular rhythm.  Pulmonary:     Effort: Pulmonary effort is normal.     Breath sounds: Normal breath sounds. No decreased breath sounds, wheezing, rhonchi or rales.  Chest:     Chest wall: No tenderness.  Abdominal:     Tenderness: There is no abdominal tenderness.  Musculoskeletal:     Right lower leg: No edema.     Left lower leg: No edema.  Skin:    Findings: No rash.  Neurological:     Mental Status: He is alert.      ED Treatments / Results  Labs (all labs ordered are listed, but only abnormal results are displayed) Labs Reviewed - No data to display  EKG None  Radiology Dg Chest 2 View  Result Date: 07/15/2018 CLINICAL DATA:  Shortness of breath.  Productive cough. EXAM: CHEST - 2 VIEW COMPARISON:  04/15/2018. FINDINGS: Mediastinum and hilar structures normal. Low lung volumes with mild bibasilar atelectasis. No pleural effusion or pneumothorax. Heart size normal. Degenerative change thoracic spine. IMPRESSION: Low lung volumes with mild bibasilar atelectasis. Electronically Signed   By: Maisie Fus  Register   On: 07/15/2018 07:35    Procedures Procedures (including critical care time)  Medications Ordered in ED Medications - No data to display   Initial Impression / Assessment and Plan / ED Course  I have reviewed the triage vital signs and the nursing notes.  Pertinent labs & imaging results that were available during my care of the patient were reviewed by me and considered in my medical decision making (see chart for details).        BP 120/74   Pulse 68   Temp 98 F (36.7 C) (Oral)   Resp 18   Ht 5\' 10"  (1.778 m)   Wt 104.3 kg   SpO2  98%   BMI 33.00 kg/m    Final Clinical Impressions(s) / ED Diagnoses   Final diagnoses:  SOB (shortness of breath)    ED Discharge Orders         Ordered    guaiFENesin-codeine (ROBITUSSIN AC) 100-10 MG/5ML syrup  3 times daily PRN     07/15/18 0809    predniSONE (DELTASONE) 20 MG tablet  Daily     07/15/18 0809         7:45 AM Patient with history of tobacco abuse, prior  bronchitis here with shortness of breath and occasional cough.  He is afebrile, vital signs stable, chest x-ray showed no evidence of pneumonia. Patient does not have any significant wheezing on exam. He is PERC negative, doubt PE.  Will ambulate and check O2.  8:07 AM Patient ambulate without difficulty, O2 sats at 98% on room air.  Given the early manifestation of his symptoms, occasional cough and history of bronchitis in the past, patient will discharge home with prednisone, antitussive and he can continue using his inhaler as needed for suspected bronchitis.  Return precautions discussed.   Fayrene Helperran, Alene Bergerson, PA-C 07/15/18 [id number]    Raeford RazorKohut, Stephen, MD 07/15/18 [phone]

## 2018-07-15 NOTE — ED Notes (Signed)
Discharge instructions (including medications) discussed with and copy provided to patient/caregiver.  Pt stable, ambulatory, and verbalizes understanding of d/c instructions.   Armband removed, pt d/c from ED.

## 2018-07-15 NOTE — ED Triage Notes (Signed)
States he was fine when he went to bed last pm states he woke up approx. 4am with sob slight cough. Denies chest pain

## 2018-07-15 NOTE — ED Notes (Signed)
Ambulated pt while checking SpO2. Pt maintained at 98% tolerated well. Pt did complain of some SOB. RN notified.

## 2018-10-28 ENCOUNTER — Emergency Department (HOSPITAL_BASED_OUTPATIENT_CLINIC_OR_DEPARTMENT_OTHER)
Admission: EM | Admit: 2018-10-28 | Discharge: 2018-10-28 | Disposition: A | Discharge status: Home / Self Care | Payer: Self-pay | Attending: Emergency Medicine | Admitting: Emergency Medicine

## 2018-10-28 ENCOUNTER — Other Ambulatory Visit: Payer: Self-pay

## 2018-10-28 ENCOUNTER — Encounter (HOSPITAL_BASED_OUTPATIENT_CLINIC_OR_DEPARTMENT_OTHER): Payer: Self-pay | Admitting: *Deleted

## 2018-10-28 DIAGNOSIS — F17200 Nicotine dependence, unspecified, uncomplicated: Secondary | ICD-10-CM | POA: Insufficient documentation

## 2018-10-28 DIAGNOSIS — Z880 Allergy status to penicillin: Secondary | ICD-10-CM | POA: Insufficient documentation

## 2018-10-28 DIAGNOSIS — Y990 Civilian activity done for income or pay: Secondary | ICD-10-CM | POA: Insufficient documentation

## 2018-10-28 DIAGNOSIS — Z79899 Other long term (current) drug therapy: Secondary | ICD-10-CM | POA: Insufficient documentation

## 2018-10-28 DIAGNOSIS — X500XXA Overexertion from strenuous movement or load, initial encounter: Secondary | ICD-10-CM | POA: Insufficient documentation

## 2018-10-28 DIAGNOSIS — Y9259 Other trade areas as the place of occurrence of the external cause: Secondary | ICD-10-CM | POA: Insufficient documentation

## 2018-10-28 DIAGNOSIS — S39012A Strain of muscle, fascia and tendon of lower back, initial encounter: Secondary | ICD-10-CM | POA: Insufficient documentation

## 2018-10-28 DIAGNOSIS — Y9389 Activity, other specified: Secondary | ICD-10-CM | POA: Insufficient documentation

## 2018-10-28 MED ORDER — CYCLOBENZAPRINE HCL 10 MG PO TABS: 10.0000 mg | ORAL_TABLET | Freq: Two times a day (BID) | ORAL | 0 refills | Status: DC | PRN

## 2018-10-28 NOTE — Discharge Instructions (Signed)
Please read instructions below.  °You can take 600 mg of ibuprofen every 6 hours as needed for pain.   °Apply ice to your back for 20 minutes at a time.  You can also apply heat if this provides more relief.   °You can take Flexeril/cyclobenzaprine every 12 hours as needed for muscle spasm.  Be aware this medication can make you drowsy; do not take while driving or drinking alcohol.   °Follow-up with your primary care provider symptoms persist.   °Return to ER if new numbness or tingling in your arms or legs, inability to urinate, inability to hold your bowels, or weakness in your extremities.  ° °

## 2018-10-28 NOTE — ED Notes (Signed)
Patient verbalizes understanding of discharge instructions. Opportunity for questioning and answers were provided. Armband removed by staff, pt discharged from ED.  

## 2018-10-28 NOTE — ED Triage Notes (Signed)
Left flank pain today at work. He feels like he pulled a muscle. He is ambulatory.

## 2018-10-28 NOTE — ED Provider Notes (Signed)
[facility] EMERGENCY DEPARTMENT Provider Note   CSN: [id number] Arrival date & time: 10/28/18  1231    History   Chief Complaint Chief Complaint  Patient presents with  . Back Pain    HPI Alejandro Young is a 28 y.o. male without significant past medical history, presenting to the emergency department with complaint of sudden onset of left-sided low back pain that began earlier today.  Patient states he works at [employer] and does lifting heavy boxes.  He states he had been doing that when he noticed the pain.  Pain is left lower back, worse with movement.  States he feels like it could be a muscle spasm or pulled muscle.  He treated with ibuprofen with some mild relief.  He denies numbness or weakness in his legs, bowel or bladder incontinence, abdominal pain, urinary symptoms.     The history is provided by the patient.    History reviewed. No pertinent past medical history.  There are no active problems to display for this patient.   Past Surgical History:  Procedure Laterality Date  . WISDOM TOOTH EXTRACTION          Home Medications    Prior to Admission medications   Medication Sig Start Date End Date Taking? Authorizing Provider  albuterol (PROVENTIL HFA;VENTOLIN HFA) 108 (90 Base) MCG/ACT inhaler Inhale into the lungs every 6 (six) hours as needed for wheezing or shortness of breath.    [provider]  azithromycin (ZITHROMAX) 250 MG tablet Take 1 tablet (250 mg total) by mouth daily. Take first 2 tablets together, then 1 every day until finished. 02/14/18   Hedges, Tinnie GensJeffrey, PA-C  benzonatate (TESSALON) 100 MG capsule Take 1 capsule (100 mg total) by mouth every 8 (eight) hours. 02/14/18   Hedges, Tinnie GensJeffrey, PA-C  Cetirizine HCl (ZYRTEC ALLERGY) 10 MG CAPS Take 1 capsule (10 mg total) by mouth at bedtime. 05/11/14   Shirleen Schirmerllis, Courtney, PA-C  cyclobenzaprine (FLEXERIL) 10 MG tablet Take 1 tablet (10 mg total) by mouth 2 (two) times daily as needed  for muscle spasms. 10/28/18   Jerolyn Flenniken, [place] N, PA-C  guaiFENesin (ROBITUSSIN) 100 MG/5ML SOLN Take 5 mLs (100 mg total) by mouth every 4 (four) hours as needed for cough or to loosen phlegm. 05/11/14   Shirleen Schirmerllis, Courtney, PA-C  guaiFENesin-codeine (ROBITUSSIN AC) 100-10 MG/5ML syrup Take 10 mLs by mouth 3 (three) times daily as needed for cough or congestion. 07/15/18   Fayrene Helperran, Bowie, PA-C  ibuprofen (ADVIL,MOTRIN) 600 MG tablet Take 1 tablet (600 mg total) by mouth every 6 (six) hours as needed. 11/01/16   Maxwell CaulLayden, Lindsey A, PA-C  lidocaine (XYLOCAINE) 2 % solution Use as directed 20 mLs in the mouth or throat as needed for mouth pain. 05/11/14   Shirleen Schirmerllis, Courtney, PA-C  methocarbamol (ROBAXIN) 500 MG tablet Take 1 tablet (500 mg total) by mouth 2 (two) times daily. 11/01/16   Maxwell CaulLayden, Lindsey A, PA-C  predniSONE (DELTASONE) 20 MG tablet Take 2 tablets (40 mg total) by mouth daily. 07/15/18   Fayrene Helperran, Bowie, PA-C  promethazine (PHENERGAN) 25 MG tablet Take 1 tablet (25 mg total) by mouth every 6 (six) hours as needed for nausea or vomiting. 12/21/13   Junious SilkMerrell, Hannah, PA-C    Family History No family history on file.  Social History Social History   Tobacco Use  . Smoking status: Current Every Day Smoker    Packs/day: 1.00  . Smokeless tobacco: Never Used  Substance Use Topics  . Alcohol  use: No  . Drug use: Yes    Types: Marijuana     Allergies   Amoxicillin and Penicillins   Review of Systems Review of Systems  Gastrointestinal: Negative for abdominal pain.  Genitourinary: Negative for dysuria and frequency.  Musculoskeletal: Positive for back pain.  Neurological: Negative for weakness and numbness.     Physical Exam Updated Vital Signs BP (!) 138/93   Pulse 70   Temp 98.2 F (36.8 C) (Oral)   Resp 14   Ht 5\' 10"  (1.778 m)   Wt 102.6 kg   SpO2 100%   BMI 32.47 kg/m   Physical Exam Vitals signs and nursing note reviewed.  Constitutional:      General: He is not in acute  distress.    Appearance: He is well-developed.     Comments: Patient sleeping upon entering the room.  No apparent distress.  HENT:     Head: Normocephalic and atraumatic.  Eyes:     Conjunctiva/sclera: Conjunctivae normal.  Cardiovascular:     Rate and Rhythm: Normal rate and regular rhythm.  Pulmonary:     Effort: Pulmonary effort is normal.  Musculoskeletal:       Back:     Comments: There is no midline spinal or paraspinal tenderness, no any step-offs or gross deformities.  There is slight tenderness to the left lower back over the region just superior to the gluteal region.  No skin changes.  Neurological:     Mental Status: He is alert.     Comments: Normal tone.  5/5 strength in BUE and BLE including strong and equal grip strength and dorsiflexion/plantar flexion Sensory: Pinprick and light touch normal in BLE extremities.  Gait: normal gait and balance CV: distal pulses palpable throughout    Psychiatric:        Mood and Affect: Mood normal.        Behavior: Behavior normal.      ED Treatments / Results  Labs (all labs ordered are listed, but only abnormal results are displayed) Labs Reviewed - No data to display  EKG None  Radiology No results found.  Procedures Procedures (including critical care time)  Medications Ordered in ED Medications - No data to display   Initial Impression / Assessment and Plan / ED Course  I have reviewed the triage vital signs and the nursing notes.  Pertinent labs & imaging results that were available during my care of the patient were reviewed by me and considered in my medical decision making (see chart for details).        Patient with back pain that began today after heavy lifting. Likely muscular in nature. No red flags.  No neurological deficits and normal neuro exam.  Patient can ambulate in the ED with normal gait.  No loss of bowel or bladder control.  No concern for cauda equina.  RICE protocol and pain medicine  indicated and discussed with patient.   Discussed results, findings, treatment and follow up. Patient advised of return precautions. Patient verbalized understanding and agreed with plan.  Final Clinical Impressions(s) / ED Diagnoses   Final diagnoses:  Strain of lumbar region, initial encounter    ED Discharge Orders         Ordered    cyclobenzaprine (FLEXERIL) 10 MG tablet  2 times daily PRN     10/28/18 1351           Jaci Desanto, [place] N, [place] 10/28/18 1352    Virgel Manifold, MD 10/30/18 1114

## 2018-12-01 ENCOUNTER — Other Ambulatory Visit: Payer: Self-pay

## 2018-12-01 DIAGNOSIS — Z20822 Contact with and (suspected) exposure to covid-19: Secondary | ICD-10-CM

## 2018-12-03 LAB — NOVEL CORONAVIRUS, NAA: SARS-CoV-2, NAA: NOT DETECTED

## 2019-01-05 ENCOUNTER — Other Ambulatory Visit: Payer: Self-pay

## 2019-01-05 DIAGNOSIS — Z20822 Contact with and (suspected) exposure to covid-19: Secondary | ICD-10-CM

## 2019-01-06 LAB — NOVEL CORONAVIRUS, NAA: SARS-CoV-2, NAA: NOT DETECTED

## 2019-06-25 ENCOUNTER — Other Ambulatory Visit: Payer: Self-pay

## 2019-06-25 ENCOUNTER — Emergency Department (HOSPITAL_COMMUNITY)
Admission: EM | Admit: 2019-06-25 | Discharge: 2019-06-25 | Disposition: A | Discharge status: Home / Self Care | Payer: 59 | Attending: Emergency Medicine | Admitting: Emergency Medicine

## 2019-06-25 ENCOUNTER — Encounter (HOSPITAL_COMMUNITY): Payer: Self-pay | Admitting: Emergency Medicine

## 2019-06-25 DIAGNOSIS — M79672 Pain in left foot: Secondary | ICD-10-CM | POA: Diagnosis present

## 2019-06-25 DIAGNOSIS — R2242 Localized swelling, mass and lump, left lower limb: Secondary | ICD-10-CM | POA: Insufficient documentation

## 2019-06-25 DIAGNOSIS — F17210 Nicotine dependence, cigarettes, uncomplicated: Secondary | ICD-10-CM | POA: Insufficient documentation

## 2019-06-25 MED ORDER — DOXYCYCLINE HYCLATE 100 MG PO CAPS: 100.0000 mg | ORAL_CAPSULE | Freq: Two times a day (BID) | ORAL | 0 refills | Status: DC

## 2019-06-25 MED ORDER — NAPROXEN 250 MG PO TABS
500.0000 mg | ORAL_TABLET | Freq: Once | ORAL | Status: AC
  Administered 2019-06-25: 05:00:00 500 mg via ORAL
  Filled 2019-06-25: qty 2

## 2019-06-25 MED ORDER — NAPROXEN 500 MG PO TABS: 500.0000 mg | ORAL_TABLET | Freq: Two times a day (BID) | ORAL | 0 refills | Status: DC

## 2019-06-25 NOTE — Discharge Instructions (Signed)
We saw you in the ER for foot pain. It is unclear why there is pain.  No signs of infection, trauma or blood flow issues noted.  We would like you to follow the RICE recommendations as printed. If not better in 2 days then start taking the antibiotics. Return to the ER if your symptoms are getting worse.

## 2019-06-25 NOTE — ED Provider Notes (Signed)
[facility] Provider Note   CSN: [id number] Arrival date & time: 06/25/19  0430     History Chief Complaint  Patient presents with  . Foot Pain    Alejandro Young is a 29 y.o. male.  HPI    29 year old male comes in with chief complaint of foot pain.  Patient reports that he woke up with left distal foot pain, primarily on the plantar surface and medial aspect.  He denies any trauma.  He has no history of similar pain.  There is no history of DVT, PE and patient denies any ankle pain, calf pain.  Patient describes the pain as constant throbbing pain at 4 out of 10 that becomes sharp and more severe when he tries to walk.  Patient denies any substance abuse, and he also denies any nausea, vomiting, fevers, chills.  History reviewed. No pertinent past medical history.  There are no problems to display for this patient.   Past Surgical History:  Procedure Laterality Date  . WISDOM TOOTH EXTRACTION         No family history on file.  Social History   Tobacco Use  . Smoking status: Current Every Day Smoker    Packs/day: 1.00  . Smokeless tobacco: Never Used  Substance Use Topics  . Alcohol use: No  . Drug use: Yes    Types: Marijuana    Home Medications Prior to Admission medications   Medication Sig Start Date End Date Taking? Authorizing Provider  albuterol (PROVENTIL HFA;VENTOLIN HFA) 108 (90 Base) MCG/ACT inhaler Inhale into the lungs every 6 (six) hours as needed for wheezing or shortness of breath.    [provider]  azithromycin (ZITHROMAX) 250 MG tablet Take 1 tablet (250 mg total) by mouth daily. Take first 2 tablets together, then 1 every day until finished. 02/14/18   Hedges, Dellis Filbert, PA-C  benzonatate (TESSALON) 100 MG capsule Take 1 capsule (100 mg total) by mouth every 8 (eight) hours. 02/14/18   Hedges, Dellis Filbert, PA-C  Cetirizine HCl (ZYRTEC ALLERGY) 10 MG CAPS Take 1 capsule (10 mg total) by mouth at bedtime. 05/11/14    Rolene Course, PA-C  cyclobenzaprine (FLEXERIL) 10 MG tablet Take 1 tablet (10 mg total) by mouth 2 (two) times daily as needed for muscle spasms. 10/28/18   Robinson, [place] N, PA-C  doxycycline (VIBRAMYCIN) 100 MG capsule Take 1 capsule (100 mg total) by mouth 2 (two) times daily. 06/26/19   Varney Biles, MD  guaiFENesin (ROBITUSSIN) 100 MG/5ML SOLN Take 5 mLs (100 mg total) by mouth every 4 (four) hours as needed for cough or to loosen phlegm. 05/11/14   Rolene Course, PA-C  guaiFENesin-codeine (ROBITUSSIN AC) 100-10 MG/5ML syrup Take 10 mLs by mouth 3 (three) times daily as needed for cough or congestion. 07/15/18   Domenic Moras, PA-C  ibuprofen (ADVIL,MOTRIN) 600 MG tablet Take 1 tablet (600 mg total) by mouth every 6 (six) hours as needed. 11/01/16   Volanda Napoleon, PA-C  lidocaine (XYLOCAINE) 2 % solution Use as directed 20 mLs in the mouth or throat as needed for mouth pain. 05/11/14   Rolene Course, PA-C  methocarbamol (ROBAXIN) 500 MG tablet Take 1 tablet (500 mg total) by mouth 2 (two) times daily. 11/01/16   Volanda Napoleon, PA-C  naproxen (NAPROSYN) 500 MG tablet Take 1 tablet (500 mg total) by mouth 2 (two) times daily. 06/25/19   Varney Biles, MD  predniSONE (DELTASONE) 20 MG tablet Take 2 tablets (40 mg total) by  mouth daily. 07/15/18   Fayrene Helper, PA-C  promethazine (PHENERGAN) 25 MG tablet Take 1 tablet (25 mg total) by mouth every 6 (six) hours as needed for nausea or vomiting. 12/21/13   Junious Silk, PA-C    Allergies    Amoxicillin and Penicillins  Review of Systems   Review of Systems  Constitutional: Positive for activity change.  Musculoskeletal: Positive for arthralgias.  Skin: Negative for wound.  Allergic/Immunologic: Negative for immunocompromised state.  Hematological: Does not bruise/bleed easily.    Physical Exam Updated Vital Signs BP 138/89 (BP Location: Right Arm)   Pulse 67   Temp 98.2 F (36.8 C) (Oral)   Resp 18   Ht 5\' 10"  (1.778 m)   Wt  102.1 kg   SpO2 100%   BMI 32.28 kg/m   Physical Exam Vitals and nursing note reviewed.  Constitutional:      Appearance: He is well-developed.  HENT:     Head: Atraumatic.  Cardiovascular:     Rate and Rhythm: Normal rate.  Pulmonary:     Effort: Pulmonary effort is normal.  Musculoskeletal:        General: Swelling and tenderness present. No deformity.     Cervical back: Neck supple.     Comments: Left foot is slightly erythematous and edematous compared to right.  Patient has tenderness over the plantar aspect of the distal foot.  There is no clear wound.  There are no pustules.  Both feet have some trace pitting edema.  No calf tenderness or swelling noted.  Range of motion over the ankles and digits is normal.  No injury in the webspace of L foot  Skin:    General: Skin is warm.  Neurological:     Mental Status: He is alert and oriented to person, place, and time.     ED Results / Procedures / Treatments   Labs (all labs ordered are listed, but only abnormal results are displayed) Labs Reviewed - No data to display  EKG None  Radiology No results found.  Procedures Procedures (including critical care time)  Medications Ordered in ED Medications  naproxen (NAPROSYN) tablet 500 mg (500 mg Oral Given 06/25/19 0518)    ED Course  I have reviewed the triage vital signs and the nursing notes.  Pertinent labs & imaging results that were available during my care of the patient were reviewed by me and considered in my medical decision making (see chart for details).    MDM Rules/Calculators/A&P                      Patient comes in a chief complaint of foot pain. He is having some throbbing discomfort that is mild to moderate when resting.  Upon ambulation the pain gets worse.  No trauma and we are pretty sure there is no fracture, dislocation.  He is not diabetic and does not have any history of gout, DVT, PE or risk factors for the same.  No imaging needed at this  time.  There is little bit of erythema and warmth to touch therefore infection considered in the differential although I do not see any foci for it.   Plan is to treat conservatively with NSAIDs. If patient does not get better over 2 days with NSAIDs and RICE then he will start taking antibiotics and follow-up with PCP.  Strict ER return precautions discussed.  Final Clinical Impression(s) / ED Diagnoses Final diagnoses:  Foot pain, left    Rx /  DC Orders ED Discharge Orders         Ordered    doxycycline (VIBRAMYCIN) 100 MG capsule  2 times daily,   Status:  Discontinued     06/25/19 0509    doxycycline (VIBRAMYCIN) 100 MG capsule  2 times daily     06/25/19 0510    naproxen (NAPROSYN) 500 MG tablet  2 times daily     06/25/19 0509           Derwood Kaplan, MD 06/25/19 [phone]

## 2019-06-25 NOTE — ED Triage Notes (Signed)
Pt here with c/o L foot pain. States he has not been able to put any weight on "front part of foot" since 10pm last night. Denies injury.

## 2019-08-06 ENCOUNTER — Emergency Department (HOSPITAL_COMMUNITY)
Admission: EM | Admit: 2019-08-06 | Discharge: 2019-08-06 | Disposition: A | Discharge status: Home / Self Care | Payer: 59 | Attending: Emergency Medicine | Admitting: Emergency Medicine

## 2019-08-06 ENCOUNTER — Other Ambulatory Visit: Payer: Self-pay

## 2019-08-06 ENCOUNTER — Encounter (HOSPITAL_COMMUNITY): Payer: Self-pay | Admitting: *Deleted

## 2019-08-06 DIAGNOSIS — Z79899 Other long term (current) drug therapy: Secondary | ICD-10-CM | POA: Insufficient documentation

## 2019-08-06 DIAGNOSIS — F1210 Cannabis abuse, uncomplicated: Secondary | ICD-10-CM | POA: Insufficient documentation

## 2019-08-06 DIAGNOSIS — F17210 Nicotine dependence, cigarettes, uncomplicated: Secondary | ICD-10-CM | POA: Diagnosis not present

## 2019-08-06 DIAGNOSIS — N342 Other urethritis: Secondary | ICD-10-CM | POA: Insufficient documentation

## 2019-08-06 DIAGNOSIS — R300 Dysuria: Secondary | ICD-10-CM | POA: Diagnosis present

## 2019-08-06 LAB — URINALYSIS, ROUTINE W REFLEX MICROSCOPIC
Bilirubin Urine: NEGATIVE
Glucose, UA: NEGATIVE mg/dL
Ketones, ur: NEGATIVE mg/dL
Nitrite: NEGATIVE
Protein, ur: NEGATIVE mg/dL
RBC / HPF: >50 RBC/hpf — ABNORMAL HIGH (ref 0–5)
Specific Gravity, Urine: 1.014 (ref 1.005–1.030)
pH: 6 (ref 5.0–8.0)

## 2019-08-06 MED ORDER — DOXYCYCLINE HYCLATE 100 MG PO CAPS: 100.0000 mg | ORAL_CAPSULE | Freq: Two times a day (BID) | ORAL | 0 refills | Status: DC

## 2019-08-06 MED ORDER — CEFTRIAXONE SODIUM 250 MG IJ SOLR
250.0000 mg | Freq: Once | INTRAMUSCULAR | Status: AC
  Administered 2019-08-06: 250 mg via INTRAMUSCULAR
  Filled 2019-08-06: qty 250

## 2019-08-06 MED ORDER — STERILE WATER FOR INJECTION IJ SOLN
INTRAMUSCULAR | Status: AC
  Administered 2019-08-06: 05:00:00 0.9 mL
  Filled 2019-08-06: qty 10

## 2019-08-06 NOTE — ED Triage Notes (Signed)
Pt c/o urinary retention, pain with urination that started two days ago,

## 2019-08-06 NOTE — Discharge Instructions (Addendum)
Take the antibiotics until gone.  You can check your test results in MyChart in a couple days.  You should wear condoms and have your sexual partner get checked for infection.

## 2019-08-06 NOTE — ED Provider Notes (Signed)
[facility] EMERGENCY DEPARTMENT Provider Note   CSN: [id number] Arrival date & time: 08/06/19  0243   Time seen 3:55 AM  History Chief Complaint  Patient presents with  . Dysuria    Alejandro Young is a 29 y.o. male.  HPI   Patient states 2 days ago he started having burning on urination, frequency, but denies hematuria, penile drip or drainage.  He denies fever, abdominal pain, nausea, or vomiting.  He states he is never had it before although he did states he had a penile drip in the past and was treated for STD.  He denies any new sexual partners in the last week.  He states he feels like his bladder is not emptying.  PCP Patient, No Pcp Per   History reviewed. No pertinent past medical history.  There are no problems to display for this patient.   Past Surgical History:  Procedure Laterality Date  . WISDOM TOOTH EXTRACTION         No family history on file.  Social History   Tobacco Use  . Smoking status: Current Every Day Smoker    Packs/day: 1.00  . Smokeless tobacco: Never Used  Substance Use Topics  . Alcohol use: No  . Drug use: Yes    Types: Marijuana    Home Medications Prior to Admission medications   Medication Sig Start Date End Date Taking? Authorizing Provider  albuterol (PROVENTIL HFA;VENTOLIN HFA) 108 (90 Base) MCG/ACT inhaler Inhale into the lungs every 6 (six) hours as needed for wheezing or shortness of breath.    [provider]  azithromycin (ZITHROMAX) 250 MG tablet Take 1 tablet (250 mg total) by mouth daily. Take first 2 tablets together, then 1 every day until finished. 02/14/18   Hedges, Tinnie Gens, PA-C  benzonatate (TESSALON) 100 MG capsule Take 1 capsule (100 mg total) by mouth every 8 (eight) hours. 02/14/18   Hedges, Tinnie Gens, PA-C  Cetirizine HCl (ZYRTEC ALLERGY) 10 MG CAPS Take 1 capsule (10 mg total) by mouth at bedtime. 05/11/14   Shirleen Schirmer, PA-C  cyclobenzaprine (FLEXERIL) 10 MG tablet Take 1 tablet (10 mg total)  by mouth 2 (two) times daily as needed for muscle spasms. 10/28/18   Robinson, [place] N, PA-C  doxycycline (VIBRAMYCIN) 100 MG capsule Take 1 capsule (100 mg total) by mouth 2 (two) times daily. 08/06/19   Devoria Albe, MD  guaiFENesin (ROBITUSSIN) 100 MG/5ML SOLN Take 5 mLs (100 mg total) by mouth every 4 (four) hours as needed for cough or to loosen phlegm. 05/11/14   Shirleen Schirmer, PA-C  guaiFENesin-codeine (ROBITUSSIN AC) 100-10 MG/5ML syrup Take 10 mLs by mouth 3 (three) times daily as needed for cough or congestion. 07/15/18   Fayrene Helper, PA-C  ibuprofen (ADVIL,MOTRIN) 600 MG tablet Take 1 tablet (600 mg total) by mouth every 6 (six) hours as needed. 11/01/16   Maxwell Caul, PA-C  lidocaine (XYLOCAINE) 2 % solution Use as directed 20 mLs in the mouth or throat as needed for mouth pain. 05/11/14   Shirleen Schirmer, PA-C  methocarbamol (ROBAXIN) 500 MG tablet Take 1 tablet (500 mg total) by mouth 2 (two) times daily. 11/01/16   Maxwell Caul, PA-C  naproxen (NAPROSYN) 500 MG tablet Take 1 tablet (500 mg total) by mouth 2 (two) times daily. 06/25/19   Derwood Kaplan, MD  predniSONE (DELTASONE) 20 MG tablet Take 2 tablets (40 mg total) by mouth daily. 07/15/18   Fayrene Helper, PA-C  promethazine (PHENERGAN) 25 MG tablet Take  1 tablet (25 mg total) by mouth every 6 (six) hours as needed for nausea or vomiting. 12/21/13   Cleatrice Burke, PA-C    Allergies    Amoxicillin and Penicillins  Review of Systems   Review of Systems  All other systems reviewed and are negative.   Physical Exam Updated Vital Signs BP (!) 145/88   Pulse 79   Temp 98.2 F (36.8 C) (Oral)   Resp 18   Ht 5\' 10"  (1.778 m)   Wt 104.3 kg   SpO2 100%   BMI 33.00 kg/m   Physical Exam Vitals and nursing note reviewed.  Constitutional:      General: He is not in acute distress.    Appearance: Normal appearance. He is normal weight.  HENT:     Head: Normocephalic and atraumatic.     Right Ear: External ear normal.      Left Ear: External ear normal.  Eyes:     Extraocular Movements: Extraocular movements intact.     Conjunctiva/sclera: Conjunctivae normal.     Pupils: Pupils are equal, round, and reactive to light.  Cardiovascular:     Rate and Rhythm: Normal rate.  Pulmonary:     Effort: Pulmonary effort is normal. No respiratory distress.  Abdominal:     General: Bowel sounds are normal.     Palpations: Abdomen is soft.     Tenderness: There is no abdominal tenderness.  Musculoskeletal:        General: Normal range of motion.     Cervical back: Normal range of motion.  Skin:    General: Skin is warm and dry.  Neurological:     General: No focal deficit present.     Mental Status: He is alert and oriented to person, place, and time.     Cranial Nerves: No cranial nerve deficit.  Psychiatric:        Mood and Affect: Mood normal.        Behavior: Behavior normal.        Thought Content: Thought content normal.     ED Results / Procedures / Treatments   Labs (all labs ordered are listed, but only abnormal results are displayed) Results for orders placed or performed during the hospital encounter of 08/06/19  Urinalysis, Routine w reflex microscopic  Result Value Ref Range   Color, Urine YELLOW YELLOW   APPearance CLEAR CLEAR   Specific Gravity, Urine 1.014 1.005 - 1.030   pH 6.0 5.0 - 8.0   Glucose, UA NEGATIVE NEGATIVE mg/dL   Hgb urine dipstick MODERATE (A) NEGATIVE   Bilirubin Urine NEGATIVE NEGATIVE   Ketones, ur NEGATIVE NEGATIVE mg/dL   Protein, ur NEGATIVE NEGATIVE mg/dL   Nitrite NEGATIVE NEGATIVE   Leukocytes,Ua MODERATE (A) NEGATIVE   RBC / HPF >50 (H) 0 - 5 RBC/hpf   WBC, UA 21-50 0 - 5 WBC/hpf   Bacteria, UA RARE (A) NONE SEEN   WBC Clumps PRESENT    Laboratory interpretation all normal except hematuria and pyuria    EKG None  Radiology No results found.  Procedures Procedures (including critical care time)  Medications Ordered in ED Medications   cefTRIAXone (ROCEPHIN) injection 250 mg (250 mg Intramuscular Given 08/06/19 0440)  sterile water (preservative free) injection (0.9 mLs  Given 08/06/19 0440)    ED Course  I have reviewed the triage vital signs and the nursing notes.  Pertinent labs & imaging results that were available during my care of the patient were reviewed by me  and considered in my medical decision making (see chart for details).    MDM Rules/Calculators/A&P                      I discussed with the patient that the most common etiology for dysuria in his age group would be an STD.  He is agreeable to being treated for that.  Bladder scan is 53 so he is not retaining urine.  Patient has a penicillin and amoxicillin allergy listed however he does not know what allergy he had.  He states he had gotten an injection before when he had penile drip.  He was given Rocephin in the ED.  Final Clinical Impression(s) / ED Diagnoses Final diagnoses:  Urethritis    Rx / DC Orders ED Discharge Orders         Ordered    doxycycline (VIBRAMYCIN) 100 MG capsule  2 times daily     08/06/19 0457         Plan discharge  Devoria Albe, MD, Concha Pyo, MD 08/06/19 [phone]

## 2019-08-07 LAB — GC/CHLAMYDIA PROBE AMP (~~LOC~~) NOT AT ARMC
Chlamydia: NEGATIVE
Comment: NEGATIVE
Comment: NORMAL
Neisseria Gonorrhea: NEGATIVE

## 2019-08-09 LAB — URINE CULTURE: Culture: >=100000 — AB

## 2019-10-12 ENCOUNTER — Emergency Department (HOSPITAL_COMMUNITY): Payer: 59

## 2019-10-12 ENCOUNTER — Emergency Department (HOSPITAL_COMMUNITY)
Admission: EM | Admit: 2019-10-12 | Discharge: 2019-10-12 | Disposition: A | Discharge status: Home / Self Care | Payer: 59 | Attending: Emergency Medicine | Admitting: Emergency Medicine

## 2019-10-12 ENCOUNTER — Other Ambulatory Visit: Payer: Self-pay

## 2019-10-12 DIAGNOSIS — S93402A Sprain of unspecified ligament of left ankle, initial encounter: Secondary | ICD-10-CM | POA: Insufficient documentation

## 2019-10-12 DIAGNOSIS — X500XXA Overexertion from strenuous movement or load, initial encounter: Secondary | ICD-10-CM | POA: Insufficient documentation

## 2019-10-12 DIAGNOSIS — F17210 Nicotine dependence, cigarettes, uncomplicated: Secondary | ICD-10-CM | POA: Insufficient documentation

## 2019-10-12 DIAGNOSIS — Z79899 Other long term (current) drug therapy: Secondary | ICD-10-CM | POA: Insufficient documentation

## 2019-10-12 DIAGNOSIS — Y9367 Activity, basketball: Secondary | ICD-10-CM | POA: Insufficient documentation

## 2019-10-12 DIAGNOSIS — Y999 Unspecified external cause status: Secondary | ICD-10-CM | POA: Insufficient documentation

## 2019-10-12 DIAGNOSIS — Y929 Unspecified place or not applicable: Secondary | ICD-10-CM | POA: Insufficient documentation

## 2019-10-12 NOTE — ED Notes (Signed)
Patient was playing basketball Tuesday and his ankle started hurting and now its getting worse. Denies falling. And not aware of twisting his ankle. Painful ROM performed. No redness or swelling noted.

## 2019-10-12 NOTE — Discharge Instructions (Signed)
Follow up with your Primary Care Doctor as needed.  ° °Follow up with referred orthopedic doctor in 1-2 weeks if no improvement of pain.  ° °You can take Tylenol or Ibuprofen as directed for pain. You can alternate Tylenol and Ibuprofen every 4 hours. If you take Tylenol at 1pm, then you can take Ibuprofen at 5pm. Then you can take Tylenol again at 9pm. Do not exceed 4000 mg of tylenol a day. Do not exceed 800 mg of ibuprofen a day.  ° °  °Return to the Emergency Department immediately for any worsening pain, redness/swelling of the ankle, gray or blue color to the toes, numbness/weakness of toes or foot, difficulty walking or any other worsening or concerning symptoms.  ° ° °Ankle sprain °Ankle sprain occurs when the ligaments that hold the ankle joint to get her are stretched or torn. It may take 4-6 weeks to heal. ° °For activity: Use crutches with nonweightbearing for the first few days. Then, you may walk on your ankles as the pain allows, or as instructed. Start gradually with weight bearing on the affected ankle. Once you can walk pain free, then try jogging. When you can run forwards, then you can try moving side to side. If you cannot walk without crutches in one week, you need a recheck by your Family Doctor. ° °If you do not have a family doctor to followup with, you can see the list of phone numbers below. Please call today to make a followup appointment. ° ° °RICE therapy:  Routine Care for injuries ° °Rest, Ice, Compression, Elevation (RICE) ° °Rest is needed to allow your body to heal. Routine activities can be resumed when comfortable. Injury tendons and bones can take up to 6 weeks to heal. Tendons are cordlike structures that attach muscles and bones. ° °Ice following an injury helps keep the swelling down and reduce the pain. Put ice in a plastic bag. Place a towel between your skin and the bag of ice. Leave the ice on for 15-20 minutes, 3-4 times a day. Do this while awake, for the first 24-48  hours. After that continue as directed by your caregiver. ° °Compression helps keep swelling down. It also gives support and helps with discomfort. If any lasting bandage has been applied, it should be removed and reapplied every 3-4 hours. It should not be applied tightly, but firmly enough to keep swelling down. Watch fingers or toes for swelling, discoloration, coldness, numbness or excessive pain. If any of these problems occur, removed the bandage and reapply loosely. Contact your caregiver if these problems continue. ° °Elevation helps reduce swelling and decrease your pain. With extremities such as the arms, hands, legs and feet, the injured area should be placed near or above the level of the heart if possible. ° ° ° °

## 2019-10-12 NOTE — ED Provider Notes (Signed)
[facility] EMERGENCY DEPARTMENT Provider Note   CSN: [id number] Arrival date & time: 10/12/19  1327     History No chief complaint on file.   Alejandro Young is a 29 y.o. male who presents for evaluation of left ankle pain.  He reports that about 4 days ago, he was playing basketball.  Initially afterwards, he had some mild pain in his left foot and ankle but states that it was manageable.  About 2 days ago, the pain became worse.  He states he has been able to ambulate and bear weight but does report that it is worsening pain when doing so.  He does not recall any specific trauma, injury or fall.  He has not taken any medications for the pain.  He denies any redness, swelling, numbness/weakness.  The history is provided by the patient.       No past medical history on file.  There are no problems to display for this patient.   Past Surgical History:  Procedure Laterality Date  . WISDOM TOOTH EXTRACTION         No family history on file.  Social History   Tobacco Use  . Smoking status: Current Every Day Smoker    Packs/day: 1.00  . Smokeless tobacco: Never Used  Substance Use Topics  . Alcohol use: No  . Drug use: Yes    Types: Marijuana    Home Medications Prior to Admission medications   Medication Sig Start Date End Date Taking? Authorizing Provider  albuterol (PROVENTIL HFA;VENTOLIN HFA) 108 (90 Base) MCG/ACT inhaler Inhale into the lungs every 6 (six) hours as needed for wheezing or shortness of breath.    [provider]  azithromycin (ZITHROMAX) 250 MG tablet Take 1 tablet (250 mg total) by mouth daily. Take first 2 tablets together, then 1 every day until finished. 02/14/18   Hedges, Tinnie Gens, PA-C  benzonatate (TESSALON) 100 MG capsule Take 1 capsule (100 mg total) by mouth every 8 (eight) hours. 02/14/18   Hedges, Tinnie Gens, PA-C  Cetirizine HCl (ZYRTEC ALLERGY) 10 MG CAPS Take 1 capsule (10 mg total) by mouth at bedtime. 05/11/14    Shirleen Schirmer, PA-C  cyclobenzaprine (FLEXERIL) 10 MG tablet Take 1 tablet (10 mg total) by mouth 2 (two) times daily as needed for muscle spasms. 10/28/18   Robinson, [place] N, PA-C  doxycycline (VIBRAMYCIN) 100 MG capsule Take 1 capsule (100 mg total) by mouth 2 (two) times daily. 08/06/19   Devoria Albe, MD  guaiFENesin (ROBITUSSIN) 100 MG/5ML SOLN Take 5 mLs (100 mg total) by mouth every 4 (four) hours as needed for cough or to loosen phlegm. 05/11/14   Shirleen Schirmer, PA-C  guaiFENesin-codeine (ROBITUSSIN AC) 100-10 MG/5ML syrup Take 10 mLs by mouth 3 (three) times daily as needed for cough or congestion. 07/15/18   Fayrene Helper, PA-C  ibuprofen (ADVIL,MOTRIN) 600 MG tablet Take 1 tablet (600 mg total) by mouth every 6 (six) hours as needed. 11/01/16   Maxwell Caul, PA-C  lidocaine (XYLOCAINE) 2 % solution Use as directed 20 mLs in the mouth or throat as needed for mouth pain. 05/11/14   Shirleen Schirmer, PA-C  methocarbamol (ROBAXIN) 500 MG tablet Take 1 tablet (500 mg total) by mouth 2 (two) times daily. 11/01/16   Maxwell Caul, PA-C  naproxen (NAPROSYN) 500 MG tablet Take 1 tablet (500 mg total) by mouth 2 (two) times daily. 06/25/19   Derwood Kaplan, MD  predniSONE (DELTASONE) 20 MG tablet Take 2 tablets (  40 mg total) by mouth daily. 07/15/18   Fayrene Helper, PA-C  promethazine (PHENERGAN) 25 MG tablet Take 1 tablet (25 mg total) by mouth every 6 (six) hours as needed for nausea or vomiting. 12/21/13   Junious Silk, PA-C    Allergies    Amoxicillin and Penicillins  Review of Systems   Review of Systems  Musculoskeletal:       Ankle pain  Skin: Negative for color change.  Neurological: Negative for weakness and numbness.  All other systems reviewed and are negative.   Physical Exam Updated Vital Signs BP 135/90 (BP Location: Left Arm)   Pulse 66   Temp 98.2 F (36.8 C) (Oral)   Resp 16   Ht 5\' 10"  (1.778 m)   Wt 99.8 kg   SpO2 98%   BMI 31.57 kg/m   Physical Exam Vitals and  nursing note reviewed.  Constitutional:      Appearance: He is well-developed.  HENT:     Head: Normocephalic and atraumatic.  Eyes:     General: No scleral icterus.       Right eye: No discharge.        Left eye: No discharge.     Conjunctiva/sclera: Conjunctivae normal.  Cardiovascular:     Pulses:          Dorsalis pedis pulses are 2+ on the right side and 2+ on the left side.  Pulmonary:     Effort: Pulmonary effort is normal.  Musculoskeletal:     Comments: Tenderness palpation of the lateral malleolus of the left ankle that extends over to the dorsal aspect.  No deformity or crepitus noted.  Plantarflexion and dorsiflexion intact.  He can wiggle all 5 toes with any difficulty.  No bony tenderness of the distal tib-fib, proximal tib-fib, knee and left lower extremity.  No tenderness palpation of the right lower extremity.  Skin:    General: Skin is warm and dry.     Comments: Good distal cap refill. LLE is not dusky in appearance or cool to touch.  Neurological:     Mental Status: He is alert.     Comments: Sensation intact along major nerve distributions of BLE  Psychiatric:        Speech: Speech normal.        Behavior: Behavior normal.     ED Results / Procedures / Treatments   Labs (all labs ordered are listed, but only abnormal results are displayed) Labs Reviewed - No data to display  EKG None  Radiology DG Ankle Complete Left  Result Date: 10/12/2019 CLINICAL DATA:  Left ankle pain for 1 week.  No known injury. EXAM: LEFT ANKLE COMPLETE - 3+ VIEW COMPARISON:  11/12/2011 FINDINGS: The ankle mortise is maintained. No ankle fracture or osteochondral abnormality. No definite ankle joint effusion. The mid and hindfoot bony structures are intact. Incidental os trigonum. IMPRESSION: No acute bony findings. Electronically Signed   By: 01/13/2012 M.D.   On: 10/12/2019 14:38    Procedures Procedures (including critical care time)  Medications Ordered in  ED Medications - No data to display  ED Course  I have reviewed the triage vital signs and the nursing notes.  Pertinent labs & imaging results that were available during my care of the patient were reviewed by me and considered in my medical decision making (see chart for details).    MDM Rules/Calculators/A&P  29 year old male who presents for evaluation of left ankle pain.  He states he was playing basketball 3 days ago but does not recall any specific trauma, injury, fall.  Started developing pain about 2 days later.  Has been able to ambulate.  No numbness/weakness, redness, swelling.  On initial ED arrival, he is afebrile, nontoxic-appearing.  Vital signs are stable.  Neurovascularly intact.  Patient with tenderness palpation noted to the lateral malleolus of the left ankle.  Concern for dislocation versus fracture versus sprain.  X-rays ordered at triage.  X-rays reviewed.  No acute bony abnormality.  I discussed results with patient.  I discussed that there could still be a musculoskeletal/ligament/tendon injury that would be contributing to his pain.  We will plan to put him in an ASO splint.  Patient encouraged at home supportive care measures.  Instructed patient to follow-up with Ortho if he does not have any improvement in the next few weeks. At this time, patient exhibits no emergent life-threatening condition that require further evaluation in ED or admission. Patient had ample opportunity for questions and discussion. All patient's questions were answered with full understanding. Strict return precautions discussed. Patient expresses understanding and agreement to plan.   Portions of this note were generated with [employer]. Dictation errors may occur despite best attempts at proofreading.   Final Clinical Impression(s) / ED Diagnoses Final diagnoses:  Sprain of left ankle, unspecified ligament, initial encounter    Rx / DC Orders ED  Discharge Orders    None       Desma Mcgregor 10/12/19 1530    Milton Ferguson, MD 10/16/19 1519

## 2019-10-12 NOTE — ED Triage Notes (Signed)
patient complains of left ankle pain x 1 week, pain worse with ambulation, denies injury

## 2019-10-12 NOTE — Progress Notes (Signed)
Orthopedic Tech Progress Note Patient Details:  ALISTAIR SENFT 1990-06-13 [id number]  Ortho Devices Type of Ortho Device: ASO Ortho Device/Splint Location: Left Lower Extremity Ortho Device/Splint Interventions: Ordered, Application, Adjustment   Post Interventions Patient Tolerated: Fair Instructions Provided: Adjustment of device, Care of device, Poper ambulation with device   Gerald Stabs 10/12/2019, 3:34 PM

## 2020-02-08 ENCOUNTER — Other Ambulatory Visit: Payer: Self-pay

## 2020-02-08 DIAGNOSIS — Z20822 Contact with and (suspected) exposure to covid-19: Secondary | ICD-10-CM

## 2020-02-09 LAB — NOVEL CORONAVIRUS, NAA: SARS-CoV-2, NAA: NOT DETECTED

## 2020-02-09 LAB — SARS-COV-2, NAA 2 DAY TAT

## 2020-05-09 ENCOUNTER — Other Ambulatory Visit: Payer: Self-pay

## 2020-05-09 DIAGNOSIS — Z20822 Contact with and (suspected) exposure to covid-19: Secondary | ICD-10-CM

## 2020-05-11 LAB — NOVEL CORONAVIRUS, NAA: SARS-CoV-2, NAA: DETECTED — AB

## 2020-05-11 LAB — SARS-COV-2, NAA 2 DAY TAT

## 2021-02-26 ENCOUNTER — Emergency Department (HOSPITAL_COMMUNITY)
Admission: EM | Admit: 2021-02-26 | Discharge: 2021-02-27 | Discharge status: Against Medical Advice | Payer: Managed Care, Other (non HMO) | Attending: Emergency Medicine | Admitting: Emergency Medicine

## 2021-02-26 ENCOUNTER — Other Ambulatory Visit: Payer: Self-pay

## 2021-02-26 DIAGNOSIS — F17210 Nicotine dependence, cigarettes, uncomplicated: Secondary | ICD-10-CM | POA: Diagnosis not present

## 2021-02-26 DIAGNOSIS — X58XXXA Exposure to other specified factors, initial encounter: Secondary | ICD-10-CM | POA: Insufficient documentation

## 2021-02-26 DIAGNOSIS — R059 Cough, unspecified: Secondary | ICD-10-CM | POA: Insufficient documentation

## 2021-02-26 DIAGNOSIS — S29012A Strain of muscle and tendon of back wall of thorax, initial encounter: Secondary | ICD-10-CM | POA: Insufficient documentation

## 2021-02-26 DIAGNOSIS — M545 Low back pain, unspecified: Secondary | ICD-10-CM | POA: Insufficient documentation

## 2021-02-26 DIAGNOSIS — Z5321 Procedure and treatment not carried out due to patient leaving prior to being seen by health care provider: Secondary | ICD-10-CM | POA: Insufficient documentation

## 2021-02-26 NOTE — ED Triage Notes (Signed)
Pt c/o mid lower back back that started last night. Denies injury/trauma. Denies urinary problems. 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 Pain worsening with movement.

## 2021-02-26 NOTE — ED Provider Notes (Signed)
Emergency Medicine Provider Triage Evaluation Note  Alejandro Young , a 30 y.o. male  was evaluated in triage.  Pt complains of back pain since last night.  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  He noted acute onset of mid lower back pain.  Notes he was coughing heavily last night, as he is a smoker, and is unsure if this exacerbated his symptoms.  He states his pain is made worse with movement.  He has not tried anything for the symptoms.  Review of Systems  Positive: Back pain Negative: Numbness, tingling, saddle anesthesia, urinary retention or incontinence, headache, lightheadedness, fevers  Physical Exam  BP (!) 132/92 (BP Location: Right Arm)   Pulse (!) 51   Temp 98.3 F (36.8 C) (Oral)   Resp 18   Ht 5\' 10"  (1.778 m)   Wt 99.8 kg   SpO2 100%   BMI 31.57 kg/m  Gen:   Awake, no distress   Resp:  Normal effort  MSK:   Moves extremities without difficulty  Other:  No midline spinal tenderness, step-offs or crepitus  Medical Decision Making  Medically screening exam initiated at 7:29 PM.  Appropriate orders placed.  was informed that the remainder of the evaluation will be completed by another provider, this initial triage assessment does not replace that evaluation, and the importance of remaining in the ED until their evaluation is complete.     Alejandro Young 02/26/21 1930    02/28/21, MD 02/26/21 2229

## 2021-02-27 ENCOUNTER — Emergency Department (HOSPITAL_BASED_OUTPATIENT_CLINIC_OR_DEPARTMENT_OTHER): Payer: Managed Care, Other (non HMO)

## 2021-02-27 ENCOUNTER — Emergency Department (HOSPITAL_BASED_OUTPATIENT_CLINIC_OR_DEPARTMENT_OTHER)
Admission: EM | Admit: 2021-02-27 | Discharge: 2021-02-27 | Disposition: A | Discharge status: Home / Self Care | Payer: Managed Care, Other (non HMO) | Source: Home / Self Care | Attending: Emergency Medicine | Admitting: Emergency Medicine

## 2021-02-27 ENCOUNTER — Encounter (HOSPITAL_BASED_OUTPATIENT_CLINIC_OR_DEPARTMENT_OTHER): Payer: Self-pay

## 2021-02-27 ENCOUNTER — Other Ambulatory Visit: Payer: Self-pay

## 2021-02-27 DIAGNOSIS — F17210 Nicotine dependence, cigarettes, uncomplicated: Secondary | ICD-10-CM | POA: Insufficient documentation

## 2021-02-27 DIAGNOSIS — S29012A Strain of muscle and tendon of back wall of thorax, initial encounter: Secondary | ICD-10-CM | POA: Insufficient documentation

## 2021-02-27 DIAGNOSIS — X58XXXA Exposure to other specified factors, initial encounter: Secondary | ICD-10-CM | POA: Insufficient documentation

## 2021-02-27 DIAGNOSIS — R059 Cough, unspecified: Secondary | ICD-10-CM | POA: Insufficient documentation

## 2021-02-27 MED ORDER — METHOCARBAMOL 500 MG PO TABS: 500.0000 mg | ORAL_TABLET | Freq: Two times a day (BID) | ORAL | 0 refills | Status: DC

## 2021-02-27 MED ORDER — NAPROXEN 500 MG PO TABS: 500.0000 mg | ORAL_TABLET | Freq: Two times a day (BID) | ORAL | 0 refills | Status: DC

## 2021-02-27 MED ORDER — ACETAMINOPHEN 325 MG PO TABS
650.0000 mg | ORAL_TABLET | Freq: Once | ORAL | Status: AC
  Administered 2021-02-27: 650 mg via ORAL
  Filled 2021-02-27: qty 2

## 2021-02-27 MED ORDER — KETOROLAC TROMETHAMINE 30 MG/ML IJ SOLN
30.0000 mg | Freq: Once | INTRAMUSCULAR | Status: AC
  Administered 2021-02-27: 30 mg via INTRAMUSCULAR
  Filled 2021-02-27: qty 1

## 2021-02-27 MED ORDER — LIDOCAINE 5 % EX PTCH: 1.0000 | MEDICATED_PATCH | CUTANEOUS | 0 refills | Status: DC

## 2021-02-27 MED ORDER — LIDOCAINE 5 % EX PTCH
1.0000 | MEDICATED_PATCH | CUTANEOUS | Status: DC
  Administered 2021-02-27: 1 via TRANSDERMAL
  Filled 2021-02-27: qty 1

## 2021-02-27 NOTE — ED Notes (Signed)
Patient called x2 for vitals recheck with no response and not visible in lobby 

## 2021-02-27 NOTE — ED Triage Notes (Signed)
Pt c/o lower back pain. Denies injury. Pain radiates to left leg. Been taking tylenol for pain.

## 2021-02-27 NOTE — Discharge Instructions (Addendum)
Take the medication as prescribed as needed. Follow-up with your primary care provider. Return to the ER if you start to experience worsening symptoms, trouble breathing, severe chest pain, numbness in arms or legs or losing control of your bowels or bladder

## 2021-02-27 NOTE — ED Provider Notes (Signed)
[facility] EMERGENCY DEPARTMENT Provider Note   CSN: [id number] Arrival date & time: 02/27/21  1512     History Chief Complaint  Patient presents with   Back Pain    Alejandro Young is a 30 y.o. male presenting to the ED with a chief complaint of left-sided back pain.  Reports mid back pain that radiates down.  Reports it is sharp and worse with movement and palpation.  He took Tylenol about 7 hours ago with only minimal improvement in symptoms.  Pain began 2 days ago when he was coughing.  He denies any prior back surgeries.  No chest pain, shortness of breath, fever, numbness in arms or legs, loss of bowel or bladder function, injury or trauma.   Back Pain Associated symptoms: no fever       History reviewed. No pertinent past medical history.  There are no problems to display for this patient.   Past Surgical History:  Procedure Laterality Date   WISDOM TOOTH EXTRACTION         History reviewed. No pertinent family history.  Social History   Tobacco Use   Smoking status: Every Day    Packs/day: 1.00    Types: Cigarettes   Smokeless tobacco: Never  Substance Use Topics   Alcohol use: No   Drug use: Yes    Types: Marijuana    Home Medications Prior to Admission medications   Medication Sig Start Date End Date Taking? Authorizing Provider  lidocaine (LIDODERM) 5 % Place 1 patch onto the skin daily. Remove & Discard patch within 12 hours or as directed by MD 02/27/21  Yes Caprina Wussow, PA-C  methocarbamol (ROBAXIN) 500 MG tablet Take 1 tablet (500 mg total) by mouth 2 (two) times daily. 02/27/21  Yes Kathrin Folden, PA-C  naproxen (NAPROSYN) 500 MG tablet Take 1 tablet (500 mg total) by mouth 2 (two) times daily. 02/27/21  Yes Neeya Prigmore, PA-C  albuterol (PROVENTIL HFA;VENTOLIN HFA) 108 (90 Base) MCG/ACT inhaler Inhale into the lungs every 6 (six) hours as needed for wheezing or shortness of breath.    [provider]  azithromycin  (ZITHROMAX) 250 MG tablet Take 1 tablet (250 mg total) by mouth daily. Take first 2 tablets together, then 1 every day until finished. 02/14/18   Hedges, Tinnie Gens, PA-C  benzonatate (TESSALON) 100 MG capsule Take 1 capsule (100 mg total) by mouth every 8 (eight) hours. 02/14/18   Hedges, Tinnie Gens, PA-C  Cetirizine HCl (ZYRTEC ALLERGY) 10 MG CAPS Take 1 capsule (10 mg total) by mouth at bedtime. 05/11/14   Shirleen Schirmer, PA-C  cyclobenzaprine (FLEXERIL) 10 MG tablet Take 1 tablet (10 mg total) by mouth 2 (two) times daily as needed for muscle spasms. 10/28/18   Robinson, [place] N, PA-C  doxycycline (VIBRAMYCIN) 100 MG capsule Take 1 capsule (100 mg total) by mouth 2 (two) times daily. 08/06/19   Devoria Albe, MD  guaiFENesin (ROBITUSSIN) 100 MG/5ML SOLN Take 5 mLs (100 mg total) by mouth every 4 (four) hours as needed for cough or to loosen phlegm. 05/11/14   Shirleen Schirmer, PA-C  guaiFENesin-codeine (ROBITUSSIN AC) 100-10 MG/5ML syrup Take 10 mLs by mouth 3 (three) times daily as needed for cough or congestion. 07/15/18   Fayrene Helper, PA-C  ibuprofen (ADVIL,MOTRIN) 600 MG tablet Take 1 tablet (600 mg total) by mouth every 6 (six) hours as needed. 11/01/16   Graciella Freer A, PA-C  lidocaine (XYLOCAINE) 2 % solution Use as directed 20 mLs in the mouth  or throat as needed for mouth pain. 05/11/14   Shirleen Schirmer, PA-C  predniSONE (DELTASONE) 20 MG tablet Take 2 tablets (40 mg total) by mouth daily. 07/15/18   Fayrene Helper, PA-C  promethazine (PHENERGAN) 25 MG tablet Take 1 tablet (25 mg total) by mouth every 6 (six) hours as needed for nausea or vomiting. 12/21/13   Junious Silk, PA-C    Allergies    Amoxicillin and Penicillins  Review of Systems   Review of Systems  Constitutional:  Negative for chills and fever.  Respiratory:  Positive for cough. Negative for shortness of breath.   Musculoskeletal:  Positive for back pain.   Physical Exam Updated Vital Signs BP (!) 141/90 (BP Location: Left Arm)    Pulse 98   Temp 98.6 F (37 C) (Oral)   Resp 20   Ht 5\' 10"  (1.778 m)   Wt 99.8 kg   SpO2 98%   BMI 31.57 kg/m   Physical Exam Vitals and nursing note reviewed.  Constitutional:      General: He is not in acute distress.    Appearance: He is well-developed. He is not diaphoretic.  HENT:     Head: Normocephalic and atraumatic.  Eyes:     General: No scleral icterus.    Conjunctiva/sclera: Conjunctivae normal.  Cardiovascular:     Rate and Rhythm: Normal rate and regular rhythm.     Heart sounds: Normal heart sounds.  Pulmonary:     Effort: Pulmonary effort is normal. No respiratory distress.     Breath sounds: Normal breath sounds.  Musculoskeletal:     Cervical back: Normal range of motion.       Back:     Comments: No midline spinal tenderness present in lumbar, thoracic or cervical spine. No step-off palpated. No visible bruising, edema or temperature change noted. No objective signs of numbness present. No saddle anesthesia.  Skin:    Findings: No rash.  Neurological:     Mental Status: He is alert.    ED Results / Procedures / Treatments   Labs (all labs ordered are listed, but only abnormal results are displayed) Labs Reviewed - No data to display  EKG None  Radiology DG Ribs Unilateral W/Chest Left  Result Date: 02/27/2021 CLINICAL DATA:  Pain. EXAM: LEFT RIBS AND CHEST - 3+ VIEW COMPARISON:  Chest x-ray 07/15/2018. FINDINGS: No fracture or other bone lesions are seen involving the ribs. There is no evidence of pneumothorax or pleural effusion. Both lungs are clear. Heart size and mediastinal contours are within normal limits. IMPRESSION: Negative. Electronically Signed   By: 07/17/2018 M.D.   On: 02/27/2021 19:19    Procedures Procedures   Medications Ordered in ED Medications  lidocaine (LIDODERM) 5 % 1 patch (1 patch Transdermal Patch Applied 02/27/21 1941)  ketorolac (TORADOL) 30 MG/ML injection 30 mg (30 mg Intramuscular Given 02/27/21 1940)   acetaminophen (TYLENOL) tablet 650 mg (650 mg Oral Given 02/27/21 1940)    ED Course  I have reviewed the triage vital signs and the nursing notes.  Pertinent labs & imaging results that were available during my care of the patient were reviewed by me and considered in my medical decision making (see chart for details).    MDM Rules/Calculators/A&P                           30 year old male presenting to the ED with a chief complaint of left-sided back pain.  Reports  mid back pain that radiates down his lower back.  Worse with palpation and movement.  Minimal improvement noted with Tylenol.  No injury or trauma no prior back surgeries, chest pain, shortness of breath, loss of bowel or bladder function, numbness in arms or legs.  On exam there is tenderness of the left paraspinal musculature as noted in the image.  No midline tenderness.  He is ambulatory here.  He has no neurological deficits.  His vital signs within normal limits.  X-ray of the chest and left-sided ribs are negative for acute abnormalities.  Suspect that symptoms are musculoskeletal in nature.  I doubt infectious or vascular cause of his symptoms based on his reassuring work-up.  No injury that would concern me for trauma.  Will treat symptomatically and have him follow-up with PCP.  Return precautions given.  All imaging, if done today, including plain films, CT scans, and ultrasounds, independently reviewed by me, and interpretations confirmed via formal radiology reads.  Patient is hemodynamically stable, in NAD, and able to ambulate in the ED. Evaluation does not show pathology that would require ongoing emergent intervention or inpatient treatment. I explained the diagnosis to the patient. Pain has been managed and has no complaints prior to discharge. Patient is comfortable with above plan and is stable for discharge at this time. All questions were answered prior to disposition. Strict return precautions for returning to  the ED were discussed. Encouraged follow up with PCP.   An After Visit Summary was printed and given to the patient.   Portions of this note were generated with Scientist, clinical (histocompatibility and immunogenetics). Dictation errors may occur despite best attempts at proofreading.  Final Clinical Impression(s) / ED Diagnoses Final diagnoses:  Strain of mid-back, initial encounter    Rx / DC Orders ED Discharge Orders          Ordered    naproxen (NAPROSYN) 500 MG tablet  2 times daily        02/27/21 1933    methocarbamol (ROBAXIN) 500 MG tablet  2 times daily        02/27/21 1933    lidocaine (LIDODERM) 5 %  Every 24 hours        02/27/21 1933             Dietrich Pates, PA-C 02/27/21 1943    Charlynne Pander, MD 02/27/21 [phone]

## 2021-05-19 ENCOUNTER — Other Ambulatory Visit: Payer: Self-pay

## 2021-05-19 ENCOUNTER — Emergency Department (HOSPITAL_BASED_OUTPATIENT_CLINIC_OR_DEPARTMENT_OTHER)
Admission: EM | Admit: 2021-05-19 | Discharge: 2021-05-19 | Disposition: A | Discharge status: Home / Self Care | Payer: Managed Care, Other (non HMO) | Attending: Emergency Medicine | Admitting: Emergency Medicine

## 2021-05-19 ENCOUNTER — Encounter (HOSPITAL_BASED_OUTPATIENT_CLINIC_OR_DEPARTMENT_OTHER): Payer: Self-pay

## 2021-05-19 DIAGNOSIS — M546 Pain in thoracic spine: Secondary | ICD-10-CM | POA: Diagnosis present

## 2021-05-19 MED ORDER — METHOCARBAMOL 500 MG PO TABS: 500.0000 mg | ORAL_TABLET | Freq: Two times a day (BID) | ORAL | 0 refills | Status: DC

## 2021-05-19 NOTE — ED Triage Notes (Signed)
Pt c/o pain to upper back started last night-denies injury-NAD-steady gait

## 2021-05-19 NOTE — Discharge Instructions (Signed)
Please use Tylenol or ibuprofen for pain.  You may use 600 mg ibuprofen every 6 hours or 1000 mg of Tylenol every 6 hours.  You may choose to alternate between the 2.  This would be most effective.  Not to exceed 4 g of Tylenol within 24 hours.  Not to exceed 3200 mg ibuprofen 24 hours.  You can use the robaxin in addition to the above up to twice daily. 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

## 2021-05-19 NOTE — ED Provider Notes (Signed)
[facility] EMERGENCY DEPARTMENT Provider Note   CSN: [id number] Arrival date & time: 05/19/21  1143     History  Chief Complaint  Patient presents with   Back Pain    Alejandro Young is a 31 y.o. male with no significant past medical history presents with 1 day of left-sided upper back pain secondary to sitting up quickly and feeling pain last night.  Patient reports that he is tried nothing for pain at this time.  Patient denies any numbness or tingling of his arms or legs.  Patient has not had any difficulty with walking, or motion.  Patient denies history of chronic corticosteroid use, cancer, IV drug use, recent fever.   Back Pain     Home Medications Prior to Admission medications   Medication Sig Start Date End Date Taking? Authorizing Provider  methocarbamol (ROBAXIN) 500 MG tablet Take 1 tablet (500 mg total) by mouth 2 (two) times daily. 05/19/21  Yes Stela Iwasaki H, PA-C  albuterol (PROVENTIL HFA;VENTOLIN HFA) 108 (90 Base) MCG/ACT inhaler Inhale into the lungs every 6 (six) hours as needed for wheezing or shortness of breath.    [provider]  azithromycin (ZITHROMAX) 250 MG tablet Take 1 tablet (250 mg total) by mouth daily. Take first 2 tablets together, then 1 every day until finished. 02/14/18   Hedges, Tinnie Gens, PA-C  benzonatate (TESSALON) 100 MG capsule Take 1 capsule (100 mg total) by mouth every 8 (eight) hours. 02/14/18   Hedges, Tinnie Gens, PA-C  Cetirizine HCl (ZYRTEC ALLERGY) 10 MG CAPS Take 1 capsule (10 mg total) by mouth at bedtime. 05/11/14   Shirleen Schirmer, PA-C  cyclobenzaprine (FLEXERIL) 10 MG tablet Take 1 tablet (10 mg total) by mouth 2 (two) times daily as needed for muscle spasms. 10/28/18   Robinson, [place] N, PA-C  doxycycline (VIBRAMYCIN) 100 MG capsule Take 1 capsule (100 mg total) by mouth 2 (two) times daily. 08/06/19   Devoria Albe, MD  guaiFENesin (ROBITUSSIN) 100 MG/5ML SOLN Take 5 mLs (100 mg total) by mouth every 4  (four) hours as needed for cough or to loosen phlegm. 05/11/14   Shirleen Schirmer, PA-C  guaiFENesin-codeine (ROBITUSSIN AC) 100-10 MG/5ML syrup Take 10 mLs by mouth 3 (three) times daily as needed for cough or congestion. 07/15/18   Fayrene Helper, PA-C  ibuprofen (ADVIL,MOTRIN) 600 MG tablet Take 1 tablet (600 mg total) by mouth every 6 (six) hours as needed. 11/01/16   Graciella Freer A, PA-C  lidocaine (LIDODERM) 5 % Place 1 patch onto the skin daily. Remove & Discard patch within 12 hours or as directed by MD 02/27/21   Dietrich Pates, PA-C  lidocaine (XYLOCAINE) 2 % solution Use as directed 20 mLs in the mouth or throat as needed for mouth pain. 05/11/14   Shirleen Schirmer, PA-C  naproxen (NAPROSYN) 500 MG tablet Take 1 tablet (500 mg total) by mouth 2 (two) times daily. 02/27/21   Khatri, Hina, PA-C  predniSONE (DELTASONE) 20 MG tablet Take 2 tablets (40 mg total) by mouth daily. 07/15/18   Fayrene Helper, PA-C  promethazine (PHENERGAN) 25 MG tablet Take 1 tablet (25 mg total) by mouth every 6 (six) hours as needed for nausea or vomiting. 12/21/13   Junious Silk, PA-C      Allergies    Amoxicillin and Penicillins    Review of Systems   Review of Systems  Musculoskeletal:  Positive for back pain.  All other systems reviewed and are negative.  Physical Exam Updated Vital Signs  BP 129/88 (BP Location: Left Arm)    Pulse 82    Temp 98.2 F (36.8 C) (Oral)    Resp 16    Ht 5\' 10"  (1.778 m)    Wt 98 kg    SpO2 100%    BMI 30.99 kg/m  Physical Exam Vitals and nursing note reviewed.  Constitutional:      General: He is not in acute distress.    Appearance: Normal appearance.  HENT:     Head: Normocephalic and atraumatic.  Eyes:     General:        Right eye: No discharge.        Left eye: No discharge.  Cardiovascular:     Rate and Rhythm: Normal rate and regular rhythm.     Pulses: Normal pulses.  Pulmonary:     Effort: Pulmonary effort is normal. No respiratory distress.  Musculoskeletal:         General: No deformity.     Comments: Intact strength 5 out of 5 bilateral upper and lower extremities.  Romberg negative, gait normal.  Patient has no midline spinal tenderness throughout.  He has some minimal paraspinous muscle tenderness on the left thoracic.  There are no step-offs or deformity noted.  There is no overlying redness.  Skin:    General: Skin is warm and dry.  Neurological:     Mental Status: He is alert and oriented to person, place, and time.  Psychiatric:        Mood and Affect: Mood normal.        Behavior: Behavior normal.    ED Results / Procedures / Treatments   Labs (all labs ordered are listed, but only abnormal results are displayed) Labs Reviewed - No data to display  EKG None  Radiology No results found.  Procedures Procedures    Medications Ordered in ED Medications - No data to display  ED Course/ Medical Decision Making/ A&P                           Medical Decision Making  This is an overall well-appearing patient with 1 day of acute left-sided upper back pain.  My differential diagnosis includes thoracic paraspinous muscle strain versus tear versus acute thoracic fracture versus epidural abscess versus osteomyelitis versus other.  This is not an exhaustive differential.  Patient with no red flags of back pain including fever, chronic corticosteroid use, history of cancer, IV drug use history.  Patient with symptoms of saddle anesthesia including no numbness or tingling in groin, difficulty with urination or defecation.  Patient is completely neurovascularly intact, intact strength of upper and lower extremities.  He has some minimal paraspinous muscle tenderness on physical exam.  He has no midline spinal tenderness.  In context of all of above will discharge with plan for ibuprofen, Tylenol, and muscle relaxant.  Encouraged follow-up with orthopedics if pain worsens or fails to improve. Final Clinical Impression(s) / ED Diagnoses Final  diagnoses:  Acute left-sided thoracic back pain    Rx / DC Orders ED Discharge Orders          Ordered    methocarbamol (ROBAXIN) 500 MG tablet  2 times daily        05/19/21 1339              Lakiyah Arntson, Elrama H, PA-C 05/19/21 1343    05/21/21, DO 05/19/21 1537

## 2021-07-10 ENCOUNTER — Emergency Department (HOSPITAL_BASED_OUTPATIENT_CLINIC_OR_DEPARTMENT_OTHER)
Admission: EM | Admit: 2021-07-10 | Discharge: 2021-07-10 | Disposition: A | Discharge status: Home / Self Care | Payer: Managed Care, Other (non HMO) | Attending: Emergency Medicine | Admitting: Emergency Medicine

## 2021-07-10 ENCOUNTER — Encounter (HOSPITAL_BASED_OUTPATIENT_CLINIC_OR_DEPARTMENT_OTHER): Payer: Self-pay

## 2021-07-10 ENCOUNTER — Other Ambulatory Visit: Payer: Self-pay

## 2021-07-10 DIAGNOSIS — M109 Gout, unspecified: Secondary | ICD-10-CM | POA: Diagnosis not present

## 2021-07-10 DIAGNOSIS — M7989 Other specified soft tissue disorders: Secondary | ICD-10-CM | POA: Diagnosis present

## 2021-07-10 MED ORDER — INDOMETHACIN 25 MG PO CAPS: 25.0000 mg | ORAL_CAPSULE | Freq: Three times a day (TID) | ORAL | 0 refills | Status: DC | PRN

## 2021-07-10 NOTE — Discharge Instructions (Signed)
Do not take ibuprofen with the Indocin.  Try eating foods low in purine as well.  If your symptoms are not improving it is important to follow-up with a sports medicine specialist. ?

## 2021-07-10 NOTE — ED Provider Notes (Signed)
?[facility] EMERGENCY DEPARTMENT ?Provider Note ? ? ?CSN: [id number] ?Arrival date & time: 07/10/21  1936 ? ?  ? ?History ? ?Chief Complaint  ?Patient presents with  ? Foot Pain  ? ? ?Alejandro Young is a 31 y.o. male. ? 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  He reports the last time this happened was probably in December.  He has had no history of injury to the ankle but reports he has seen a doctor in the past who did an ultrasound of the ankle and told him he had gout.  It started after playing basketball on Sunday.  He denies drinking alcohol or eating excessive red meat.  He has intermittently taken ibuprofen for the pain which has helped intermittently but yesterday he walked on it more and reports today it felt much worse.  He denies any fever, new injury or any other acute findings. ? ?The history is provided by the patient.  ?Foot Pain ? ? ?  ? ?Home Medications ?Prior to Admission medications   ?Medication Sig Start Date End Date Taking? Authorizing Provider  ?indomethacin (INDOCIN) 25 MG capsule Take 1 capsule (25 mg total) by mouth 3 (three) times daily as needed. 07/10/21  Yes Blanchie Dessert, MD  ?albuterol (PROVENTIL HFA;VENTOLIN HFA) 108 (90 Base) MCG/ACT inhaler Inhale into the lungs every 6 (six) hours as needed for wheezing or shortness of breath.    [provider]  ?azithromycin (ZITHROMAX) 250 MG tablet Take 1 tablet (250 mg total) by mouth daily. Take first 2 tablets together, then 1 every day until finished. 02/14/18   Hedges, Dellis Filbert, PA-C  ?benzonatate (TESSALON) 100 MG capsule Take 1 capsule (100 mg total) by mouth every 8 (eight) hours. 02/14/18   Okey Regal, PA-C  ?Cetirizine HCl (ZYRTEC ALLERGY) 10 MG CAPS Take 1 capsule (10 mg total) by mouth at bedtime. 05/11/14   Rolene Course, PA-C   ?cyclobenzaprine (FLEXERIL) 10 MG tablet Take 1 tablet (10 mg total) by mouth 2 (two) times daily as needed for muscle spasms. 10/28/18   Robinson, [place] N, PA-C  ?doxycycline (VIBRAMYCIN) 100 MG capsule Take 1 capsule (100 mg total) by mouth 2 (two) times daily. 08/06/19   Rolland Porter, MD  ?guaiFENesin (ROBITUSSIN) 100 MG/5ML SOLN Take 5 mLs (100 mg total) by mouth every 4 (four) hours as needed for cough or to loosen phlegm. 05/11/14   Rolene Course, PA-C  ?guaiFENesin-codeine (ROBITUSSIN AC) 100-10 MG/5ML syrup Take 10 mLs by mouth 3 (three) times daily as needed for cough or congestion. 07/15/18   Domenic Moras, PA-C  ?ibuprofen (ADVIL,MOTRIN) 600 MG tablet Take 1 tablet (600 mg total) by mouth every 6 (six) hours as needed. 11/01/16   Volanda Napoleon, PA-C  ?lidocaine (LIDODERM) 5 % Place 1 patch onto the skin daily. Remove & Discard patch within 12 hours or as directed by MD 02/27/21   Delia Heady, PA-C  ?lidocaine (XYLOCAINE) 2 % solution Use as directed 20 mLs in the mouth or throat as needed for mouth pain. 05/11/14   Rolene Course, PA-C  ?methocarbamol (ROBAXIN) 500 MG tablet Take 1 tablet (500 mg total) by mouth 2 (two) times daily. 05/19/21   Prosperi, Christian H, PA-C  ?naproxen (NAPROSYN) 500 MG tablet Take 1 tablet (500 mg total) by mouth 2 (two) times daily. 02/27/21  Khatri, Hina, PA-C  ?predniSONE (DELTASONE) 20 MG tablet Take 2 tablets (40 mg total) by mouth daily. 07/15/18   Domenic Moras, PA-C  ?promethazine (PHENERGAN) 25 MG tablet Take 1 tablet (25 mg total) by mouth every 6 (six) hours as needed for nausea or vomiting. 12/21/13   Cleatrice Burke, PA-C  ?   ? ?Allergies    ?Amoxicillin and Penicillins   ? ?Review of Systems   ?Review of Systems ? ?Physical Exam ?Updated Vital Signs ?BP (!) 152/90 (BP Location: Left Arm)   Pulse (!) 110   Temp 98.9 ?F (37.2 ?C) (Oral)   Resp 20   Ht 5\' 10"  (1.778 m)   Wt 100.7 kg   SpO2 100%   BMI 31.85 kg/m?  ?Physical Exam ?Vitals and nursing note reviewed.   ?Constitutional:   ?   General: He is not in acute distress. ?   Appearance: Normal appearance.  ?HENT:  ?   Head: Normocephalic.  ?Cardiovascular:  ?   Rate and Rhythm: Normal rate.  ?Pulmonary:  ?   Effort: Pulmonary effort is normal.  ?Musculoskeletal:     ?   General: Swelling and tenderness present.  ?   Left ankle: Swelling present. Tenderness present over the lateral malleolus. Normal range of motion.  ?   Comments: Warmth with palpation but no significant erythema  ?Skin: ?   General: Skin is warm.  ?Neurological:  ?   Mental Status: He is alert and oriented to person, place, and time. Mental status is at baseline.  ?Psychiatric:     ?   Mood and Affect: Mood normal.     ?   Behavior: Behavior normal.  ? ? ?ED Results / Procedures / Treatments   ?Labs ?(all labs ordered are listed, but only abnormal results are displayed) ?Labs Reviewed - No data to display ? ?EKG ?None ? ?Radiology ?No results found. ? ?Procedures ?Procedures  ? ? ?Medications Ordered in ED ?Medications - No data to display ? ?ED Course/ Medical Decision Making/ A&P ?  ?                        ?Medical Decision Making ?Risk ?Prescription drug management. ? ? ?Patient presenting today with symptoms most consistent with gout in the left ankle.  This is a recurrent process which she has had before.  He has no systemic symptoms and low suspicion for septic joint at this time.  He denies any injury and reports he has had an x-ray in the past which has been negative.  We will treat with anti-inflammatories and he was given follow-up. ? ? ? ? ? ? ? ?Final Clinical Impression(s) / ED Diagnoses ?Final diagnoses:  ?Acute gout of left ankle, unspecified cause  ? ? ?Rx / DC Orders ?ED Discharge Orders   ? ?      Ordered  ?  indomethacin (INDOCIN) 25 MG capsule  3 times daily PRN       ? 07/10/21 2150  ? ?  ?  ? ?  ? ? ?  ?Blanchie Dessert, MD ?07/10/21 2240 ? ?

## 2021-07-10 NOTE — ED Triage Notes (Signed)
Pt c/o intermittent pain, swelling, redness to left foot x ~1year-worse x 4 days-denies injury-NAD-limping gait ?

## 2021-07-10 NOTE — ED Notes (Signed)
Patient discharged to home.  All discharge instructions reviewed.  Patient verbalized understanding via teachback method.  VS WDL.  Respirations even and unlabored.  Ambulatory out of ED.   °

## 2021-11-23 ENCOUNTER — Ambulatory Visit (HOSPITAL_COMMUNITY)
Admission: EM | Admit: 2021-11-23 | Discharge: 2021-11-23 | Disposition: A | Discharge status: Home / Self Care | Payer: Managed Care, Other (non HMO)

## 2021-11-23 ENCOUNTER — Encounter (HOSPITAL_COMMUNITY): Payer: Self-pay

## 2021-11-23 DIAGNOSIS — K2970 Gastritis, unspecified, without bleeding: Secondary | ICD-10-CM

## 2021-11-23 DIAGNOSIS — R112 Nausea with vomiting, unspecified: Secondary | ICD-10-CM

## 2021-11-23 DIAGNOSIS — R197 Diarrhea, unspecified: Secondary | ICD-10-CM | POA: Diagnosis not present

## 2021-11-23 NOTE — Discharge Instructions (Addendum)
Advised to return to work full status starting Monday.

## 2021-11-23 NOTE — ED Triage Notes (Signed)
Pt states that he needs a note to return to work from being out last week sick.

## 2021-11-23 NOTE — ED Provider Notes (Signed)
[facility]    CSN: [id number] Arrival date & time: 11/23/21  1535      History   Chief Complaint Chief Complaint  Patient presents with   Nausea    HPI LAVI SHEEHAN is a 31 y.o. male.   31 year old male presents for work note.  Patient indicates that he ate at the [employer] and had food to eat [place].  Patient indicates that later that evening around 4:00 in the morning he woke up with nausea, vomiting, and diarrhea.  This lasted throughout the day and then finally resolved on its own.  Patient relates today he is feeling better and is not having any problems and he needs a note to return to work.  Patient is without fever or chills.     History reviewed. No pertinent past medical history.  There are no problems to display for this patient.   Past Surgical History:  Procedure Laterality Date   WISDOM TOOTH EXTRACTION         Home Medications    Prior to Admission medications   Medication Sig Start Date End Date Taking? Authorizing Provider  ibuprofen (ADVIL,MOTRIN) 600 MG tablet Take 1 tablet (600 mg total) by mouth every 6 (six) hours as needed. 11/01/16  Yes Graciella Freer A, PA-C  albuterol (PROVENTIL HFA;VENTOLIN HFA) 108 (90 Base) MCG/ACT inhaler Inhale into the lungs every 6 (six) hours as needed for wheezing or shortness of breath.    [provider]  azithromycin (ZITHROMAX) 250 MG tablet Take 1 tablet (250 mg total) by mouth daily. Take first 2 tablets together, then 1 every day until finished. 02/14/18   Hedges, Tinnie Gens, PA-C  benzonatate (TESSALON) 100 MG capsule Take 1 capsule (100 mg total) by mouth every 8 (eight) hours. 02/14/18   Hedges, Tinnie Gens, PA-C  Cetirizine HCl (ZYRTEC ALLERGY) 10 MG CAPS Take 1 capsule (10 mg total) by mouth at bedtime. 05/11/14   Shirleen Schirmer, PA-C  cyclobenzaprine (FLEXERIL) 10 MG tablet Take 1 tablet (10 mg total) by mouth 2 (two) times daily as needed for muscle spasms. 10/28/18   Robinson, [place] N,  PA-C  doxycycline (VIBRAMYCIN) 100 MG capsule Take 1 capsule (100 mg total) by mouth 2 (two) times daily. 08/06/19   Devoria Albe, MD  guaiFENesin (ROBITUSSIN) 100 MG/5ML SOLN Take 5 mLs (100 mg total) by mouth every 4 (four) hours as needed for cough or to loosen phlegm. 05/11/14   Shirleen Schirmer, PA-C  guaiFENesin-codeine (ROBITUSSIN AC) 100-10 MG/5ML syrup Take 10 mLs by mouth 3 (three) times daily as needed for cough or congestion. 07/15/18   Fayrene Helper, PA-C  indomethacin (INDOCIN) 25 MG capsule Take 1 capsule (25 mg total) by mouth 3 (three) times daily as needed. 07/10/21   Gwyneth Sprout, MD  lidocaine (LIDODERM) 5 % Place 1 patch onto the skin daily. Remove & Discard patch within 12 hours or as directed by MD 02/27/21   Dietrich Pates, PA-C  lidocaine (XYLOCAINE) 2 % solution Use as directed 20 mLs in the mouth or throat as needed for mouth pain. 05/11/14   Shirleen Schirmer, PA-C  methocarbamol (ROBAXIN) 500 MG tablet Take 1 tablet (500 mg total) by mouth 2 (two) times daily. 05/19/21   Prosperi, Christian H, PA-C  naproxen (NAPROSYN) 500 MG tablet Take 1 tablet (500 mg total) by mouth 2 (two) times daily. 02/27/21   Khatri, Hina, PA-C  predniSONE (DELTASONE) 20 MG tablet Take 2 tablets (40 mg total) by mouth daily. 07/15/18  Fayrene Helper, PA-C  promethazine (PHENERGAN) 25 MG tablet Take 1 tablet (25 mg total) by mouth every 6 (six) hours as needed for nausea or vomiting. 12/21/13   Junious Silk, PA-C    Family History History reviewed. No pertinent family history.  Social History Social History   Tobacco Use   Smoking status: Every Day    Packs/day: 1.00    Types: Cigarettes, Cigars   Smokeless tobacco: Never  Vaping Use   Vaping Use: Former  Substance Use Topics   Alcohol use: Not Currently    Comment: occ   Drug use: Yes    Types: Marijuana     Allergies   Amoxicillin and Penicillins   Review of Systems Review of Systems  Gastrointestinal:  Positive for diarrhea, nausea and  vomiting.     Physical Exam Triage Vital Signs ED Triage Vitals  Enc Vitals Group     BP 11/23/21 1631 128/88     Pulse Rate 11/23/21 1631 75     Resp 11/23/21 1631 16     Temp 11/23/21 1631 98.6 F (37 C)     Temp Source 11/23/21 1631 Oral     SpO2 11/23/21 1631 96 %     Weight --      Height --      Head Circumference --      Peak Flow --      Pain Score 11/23/21 1628 0     Pain Loc --      Pain Edu? --      Excl. in [place]? --    No data found.  Updated Vital Signs BP 128/88 (BP Location: Left Arm)   Pulse 75   Temp 98.6 F (37 C) (Oral)   Resp 16   SpO2 96%   Visual Acuity Right Eye Distance:   Left Eye Distance:   Bilateral Distance:    Right Eye Near:   Left Eye Near:    Bilateral Near:     Physical Exam Constitutional:      Appearance: Normal appearance.  Abdominal:     General: Abdomen is flat. Bowel sounds are normal.     Palpations: Abdomen is soft.     Tenderness: There is no abdominal tenderness. There is no guarding or rebound.  Neurological:     Mental Status: He is alert.      UC Treatments / Results  Labs (all labs ordered are listed, but only abnormal results are displayed) Labs Reviewed - No data to display  EKG   Radiology No results found.  Procedures Procedures (including critical care time)  Medications Ordered in UC Medications - No data to display  Initial Impression / Assessment and Plan / UC Course  I have reviewed the triage vital signs and the nursing notes.  Pertinent labs & imaging results that were available during my care of the patient were reviewed by me and considered in my medical decision making (see chart for details).    Plan: 1.  Patient advised to return to full work status. 2.  Patient advised to follow-up with PCP or return to urgent care as needed Final Clinical Impressions(s) / UC Diagnoses   Final diagnoses:  Diarrhea, unspecified type  Nausea and vomiting, unspecified vomiting type   Gastritis and gastroduodenitis     Discharge Instructions      Advised to return to work full status starting Monday.    ED Prescriptions   None    PDMP not reviewed this encounter.  Ellsworth Lennox, PA-C 11/23/21 1657

## 2022-01-31 ENCOUNTER — Ambulatory Visit (HOSPITAL_COMMUNITY)
Admission: RE | Admit: 2022-01-31 | Discharge: 2022-01-31 | Disposition: A | Discharge status: Home / Self Care | Payer: Managed Care, Other (non HMO) | Source: Ambulatory Visit | Attending: Emergency Medicine | Admitting: Emergency Medicine

## 2022-01-31 ENCOUNTER — Encounter (HOSPITAL_COMMUNITY): Payer: Self-pay

## 2022-01-31 VITALS — BP 123/83 | HR 97 | Temp 98.0°F | Resp 20

## 2022-01-31 DIAGNOSIS — J209 Acute bronchitis, unspecified: Secondary | ICD-10-CM | POA: Diagnosis not present

## 2022-01-31 MED ORDER — BENZONATATE 100 MG PO CAPS: 100.0000 mg | ORAL_CAPSULE | Freq: Three times a day (TID) | ORAL | 0 refills | Status: AC

## 2022-01-31 MED ORDER — PREDNISONE 20 MG PO TABS: 40.0000 mg | ORAL_TABLET | Freq: Every day | ORAL | 0 refills | Status: AC

## 2022-01-31 MED ORDER — PROMETHAZINE-DM 6.25-15 MG/5ML PO SYRP: 5.0000 mL | ORAL_SOLUTION | Freq: Four times a day (QID) | ORAL | 0 refills | Status: AC | PRN

## 2022-01-31 NOTE — Discharge Instructions (Signed)
Today you are being treated for inflammation to your upper airways, your symptoms are most likely caused by a virus and will resolve with time however will make it easier for you to please  Begin use of prednisone every morning with food for 5 days to help reduce inflammation   Continue use of albuterol inhaler to help reduce shortness of breath and wheezing  Use Tessalon pill every 8 hours to help calm your healthy  May use promethazine DM for coughing and additional comfort, be mindful this medication may make you drowsy  May continue use over-the-counter medications for additional comfort   In addition:  Maintaining adequate hydration may help to thin secretions and soothe the respiratory mucosa   Warm Liquids- Ingestion of warm liquids may have a soothing effect on the respiratory mucosa, increase the flow of nasal mucus, and loosen respiratory secretions, making them easier to remove  May try honey (2.5 to 5 mL [0.5 to 1 teaspoon]) can be given straight or diluted in liquid (juice). Corn syrup may be substituted if honey is not available.

## 2022-01-31 NOTE — ED Triage Notes (Signed)
Patient with c/o cough and congestion. Denies fever and sore throat. States he is getting short of breath with the symptoms now.

## 2022-01-31 NOTE — ED Provider Notes (Signed)
[place]    CSN: [id number] Arrival date & time: 01/31/22  1315      History   Chief Complaint Chief Complaint  Patient presents with   Influenza    Entered by patient   Cough    HPI Alejandro Young is a 31 y.o. male.   Patient presents with body aches, nasal congestion, rhinorrhea, productive cough, shortness of breath at rest worsened by exertion and intermittent wheezing for 4 days.  No known sick contacts.  Tolerating food and liquids.  Has attempted use of NyQuil and DayQuil which have been ineffective.  History of tobacco abuse, endorses history of asthma, has rescue inhaler at home.  Denies fever, chills, ear pain, sore throat.   History reviewed. No pertinent past medical history.  There are no problems to display for this patient.   Past Surgical History:  Procedure Laterality Date   WISDOM TOOTH EXTRACTION         Home Medications    Prior to Admission medications   Not on File    Family History No family history on file.  Social History Social History   Tobacco Use   Smoking status: Every Day    Packs/day: 1.00    Types: Cigarettes, Cigars   Smokeless tobacco: Never  Vaping Use   Vaping Use: Former  Substance Use Topics   Alcohol use: Not Currently    Comment: occ   Drug use: Yes    Types: Marijuana     Allergies   Amoxicillin and Penicillins   Review of Systems Review of Systems  Constitutional: Negative.   HENT:  Positive for congestion and rhinorrhea. Negative for dental problem, drooling, ear discharge, ear pain, facial swelling, hearing loss, mouth sores, nosebleeds, postnasal drip, sinus pressure, sinus pain, sneezing, sore throat, tinnitus, trouble swallowing and voice change.   Respiratory:  Positive for cough, shortness of breath and wheezing. Negative for apnea, choking, chest tightness and stridor.   Musculoskeletal:  Positive for myalgias. Negative for arthralgias, back pain, gait problem, joint swelling,  neck pain and neck stiffness.  Skin: Negative.      Physical Exam Triage Vital Signs ED Triage Vitals  Enc Vitals Group     BP 01/31/22 1342 123/83     Pulse Rate 01/31/22 1342 97     Resp 01/31/22 1342 20     Temp 01/31/22 1342 98 F (36.7 C)     Temp Source 01/31/22 1342 Oral     SpO2 01/31/22 1342 95 %     Weight --      Height --      Head Circumference --      Peak Flow --      Pain Score 01/31/22 1338 0     Pain Loc --      Pain Edu? --      Excl. in [place]? --    No data found.  Updated Vital Signs BP 123/83 (BP Location: Right Arm)   Pulse 97   Temp 98 F (36.7 C) (Oral)   Resp 20   SpO2 95%   Visual Acuity Right Eye Distance:   Left Eye Distance:   Bilateral Distance:    Right Eye Near:   Left Eye Near:    Bilateral Near:     Physical Exam Constitutional:      Appearance: He is ill-appearing.  HENT:     Head: Normocephalic.     Right Ear: Tympanic membrane, ear canal and external ear normal.  Left Ear: Tympanic membrane, ear canal and external ear normal.     Nose: Congestion and rhinorrhea present.     Mouth/Throat:     Mouth: Mucous membranes are moist.     Pharynx: Oropharynx is clear.  Eyes:     Extraocular Movements: Extraocular movements intact.  Cardiovascular:     Rate and Rhythm: Normal rate and regular rhythm.     Pulses: Normal pulses.     Heart sounds: Normal heart sounds.  Pulmonary:     Effort: Pulmonary effort is normal.     Breath sounds: Normal breath sounds.  Skin:    General: Skin is warm and dry.  Neurological:     Mental Status: He is alert and oriented to person, place, and time. Mental status is at baseline.  Psychiatric:        Mood and Affect: Mood normal.        Behavior: Behavior normal.      UC Treatments / Results  Labs (all labs ordered are listed, but only abnormal results are displayed) Labs Reviewed - No data to display  EKG   Radiology No results found.  Procedures Procedures (including  critical care time)  Medications Ordered in UC Medications - No data to display  Initial Impression / Assessment and Plan / UC Course  I have reviewed the triage vital signs and the nursing notes.  Pertinent labs & imaging results that were available during my care of the patient were reviewed by me and considered in my medical decision making (see chart for details).  Acute bronchitis  Etiology is most likely viral, Patient is in no signs of distress nor toxic appearing.  Vital signs are stable.  Lungs are clear to auscultation therefore will defer imaging, symptomology is consistent with bronchitis and patient has history of tobacco abuse  Prescribed prednisone, Tessalon and Promethazine DM   May use additional over-the-counter medications as needed for supportive care.  May follow-up with urgent care as needed if symptoms persist or worsen.  Note given.   Final Clinical Impressions(s) / UC Diagnoses   Final diagnoses:  None   Discharge Instructions   None    ED Prescriptions   None    PDMP not reviewed this encounter.   Valinda Hoar, NP 01/31/22 1409

## 2022-02-03 ENCOUNTER — Telehealth (HOSPITAL_COMMUNITY): Payer: Self-pay | Admitting: Emergency Medicine

## 2022-02-03 NOTE — Telephone Encounter (Signed)
Patient came by to have form completed to return to work.  States, otherwise, he needs his work note updated to state "NO RESTRICTIONS".  Chart review, patient was here for bronchitis, SpO2 was good, no adventitious lung sounds.  Reviewed with providers on site, and work note updated.

## 2022-02-25 ENCOUNTER — Ambulatory Visit (HOSPITAL_COMMUNITY)
Admission: RE | Admit: 2022-02-25 | Discharge: 2022-02-25 | Disposition: A | Discharge status: Home / Self Care | Payer: Managed Care, Other (non HMO) | Source: Ambulatory Visit | Attending: Internal Medicine | Admitting: Internal Medicine

## 2022-02-25 ENCOUNTER — Encounter (HOSPITAL_COMMUNITY): Payer: Self-pay

## 2022-02-25 ENCOUNTER — Ambulatory Visit (INDEPENDENT_AMBULATORY_CARE_PROVIDER_SITE_OTHER): Payer: Managed Care, Other (non HMO)

## 2022-02-25 VITALS — BP 142/85 | HR 96 | Temp 98.0°F | Resp 20

## 2022-02-25 DIAGNOSIS — M545 Low back pain, unspecified: Secondary | ICD-10-CM | POA: Diagnosis not present

## 2022-02-25 MED ORDER — METHYLPREDNISOLONE SODIUM SUCC 125 MG IJ SOLR
80.0000 mg | Freq: Once | INTRAMUSCULAR | Status: AC
  Administered 2022-02-25: 80 mg via INTRAMUSCULAR

## 2022-02-25 MED ORDER — METHYLPREDNISOLONE SODIUM SUCC 125 MG IJ SOLR
INTRAMUSCULAR | Status: AC
  Filled 2022-02-25: qty 2

## 2022-02-25 MED ORDER — KETOROLAC TROMETHAMINE 60 MG/2ML IM SOLN
60.0000 mg | Freq: Once | INTRAMUSCULAR | Status: AC
  Administered 2022-02-25: 60 mg via INTRAMUSCULAR

## 2022-02-25 MED ORDER — METHOCARBAMOL 500 MG PO TABS: 500.0000 mg | ORAL_TABLET | Freq: Two times a day (BID) | ORAL | 0 refills | Status: AC

## 2022-02-25 MED ORDER — KETOROLAC TROMETHAMINE 60 MG/2ML IM SOLN
INTRAMUSCULAR | Status: AC
  Filled 2022-02-25: qty 2

## 2022-02-25 NOTE — ED Provider Notes (Signed)
[facility]   [id number] 02/25/22 Arrival Time: 1054  ASSESSMENT & PLAN:  1. Acute bilateral low back pain without sciatica    -Muscular low back pain.  X-rays negative for spondylolisthesis.  Recommended rest and activity modification.  We will treat him with IM injection of Toradol and Solu-Medrol today.  We will also prescribe Robaxin for spasm relief.  Also recommended ibuprofen, heat, ice as adjunct therapies.  Reassurance provided.  Work note given.  All questions answered he agrees to plan.  Meds ordered this encounter  Medications   ketorolac (TORADOL) injection 60 mg   methylPREDNISolone sodium succinate (SOLU-MEDROL) 125 mg/2 mL injection 80 mg   methocarbamol (ROBAXIN) 500 MG tablet    Sig: Take 1 tablet (500 mg total) by mouth 2 (two) times daily.    Dispense:  20 tablet    Refill:  0   Discharge Instructions   None       Reviewed expectations re: course of current medical issues. Questions answered. Outlined signs and symptoms indicating need for more acute intervention. Patient verbalized understanding. After Visit Summary given.   SUBJECTIVE: 31 year old male comes to urgent care to be evaluated for low back pain.  Started on Friday.  Pain is sharp and located both midline and to the left of midline.  Started after work on Friday.  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  He denies any morning lifting movement the injured the back.  Denies any inciting trauma.  Has not tried take any medications.  Denies any numbness or tingling down the leg.  Denies any weakness in the leg.  Denies any numbness or tingling in the groin.  No LMP for male patient. Past Surgical History:  Procedure Laterality Date   WISDOM TOOTH EXTRACTION       OBJECTIVE:  Vitals:   02/25/22 1116 02/25/22 1117  BP:  (!) 142/85  Pulse:  96  Resp:  20  Temp:  98 F (36.7 C)  TempSrc:  Oral  SpO2: 98% 98%     Physical Exam Vitals reviewed.   Constitutional:      Appearance: Normal appearance. He is not ill-appearing.  Cardiovascular:     Rate and Rhythm: Normal rate.  Pulmonary:     Effort: Pulmonary effort is normal.  Musculoskeletal:     Cervical back: Normal range of motion. No rigidity.     Comments: Lumbar spine -no obvious deformity.  Mildly tender to palpation at the lumbar spinous processes, no bony step-off.  Tender to palpation of the left paraspinal musculature.  Full range of motion in flexion, extension, side bending and twisting.  Negative straight leg raise bilaterally.      Labs: Results for orders placed or performed in visit on 05/09/20  Novel Coronavirus, NAA ([facility])   Specimen: Nasopharyngeal(NP) swabs in vial transport medium   Nasopharynge  Result Value Ref Range   SARS-CoV-2, NAA Detected (A) Not Detected  SARS-COV-2, NAA 2 DAY TAT   Nasopharynge  Result Value Ref Range   SARS-CoV-2, NAA 2 DAY TAT Performed    Labs Reviewed - No data to display  Imaging: DG Lumbar Spine Complete  Result Date: 02/25/2022 CLINICAL DATA:  Low back pain EXAM: LUMBAR SPINE - COMPLETE 4+ VIEW COMPARISON:  None Available. FINDINGS: There is no evidence of lumbar spine fracture. Alignment is normal. Intervertebral disc spaces are maintained. IMPRESSION: Negative. Electronically Signed   By: Marlan Palau M.D.   On: 02/25/2022 11:56  Allergies  Allergen Reactions   Amoxicillin    Penicillins                                                History reviewed. No pertinent past medical history.  Social History   Socioeconomic History   Marital status: Single    Spouse name: Not on file   Number of children: Not on file   Years of education: Not on file   Highest education level: Not on file  Occupational History   Not on file  Tobacco Use   Smoking status: Every Day    Packs/day: 1.00    Types: Cigarettes, Cigars   Smokeless tobacco: Never  Vaping Use   Vaping Use: Former  Substance and Sexual  Activity   Alcohol use: Not Currently    Comment: occ   Drug use: Yes    Types: Marijuana   Sexual activity: Yes    Birth control/protection: None  Other Topics Concern   Not on file  Social History Narrative   Not on file   Social Determinants of Health   Financial Resource Strain: Not on file  Food Insecurity: Not on file  Transportation Needs: Not on file  Physical Activity: Not on file  Stress: Not on file  Social Connections: Not on file  Intimate Partner Violence: Not on file    History reviewed. No pertinent family history.    Amalio Loe, Dorian Pod, MD 02/25/22 1312

## 2022-02-25 NOTE — ED Triage Notes (Signed)
Pt reports waking up on Friday with upper and lower back pain. 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 Denies any other obvious injuries to back. Denies taking any OTC medication for pain.

## 2022-04-23 ENCOUNTER — Ambulatory Visit: Payer: Managed Care, Other (non HMO) | Admitting: Family Medicine

## 2022-08-03 IMAGING — DX DG RIBS W/ CHEST 3+V*L*
3 series · 3 of 3 positions shown · non-contrast
Comparison: Chest x-ray 07/15/2018.

CLINICAL DATA: Pain.

EXAM:
LEFT RIBS AND CHEST - 3+ VIEW

[chest pa]
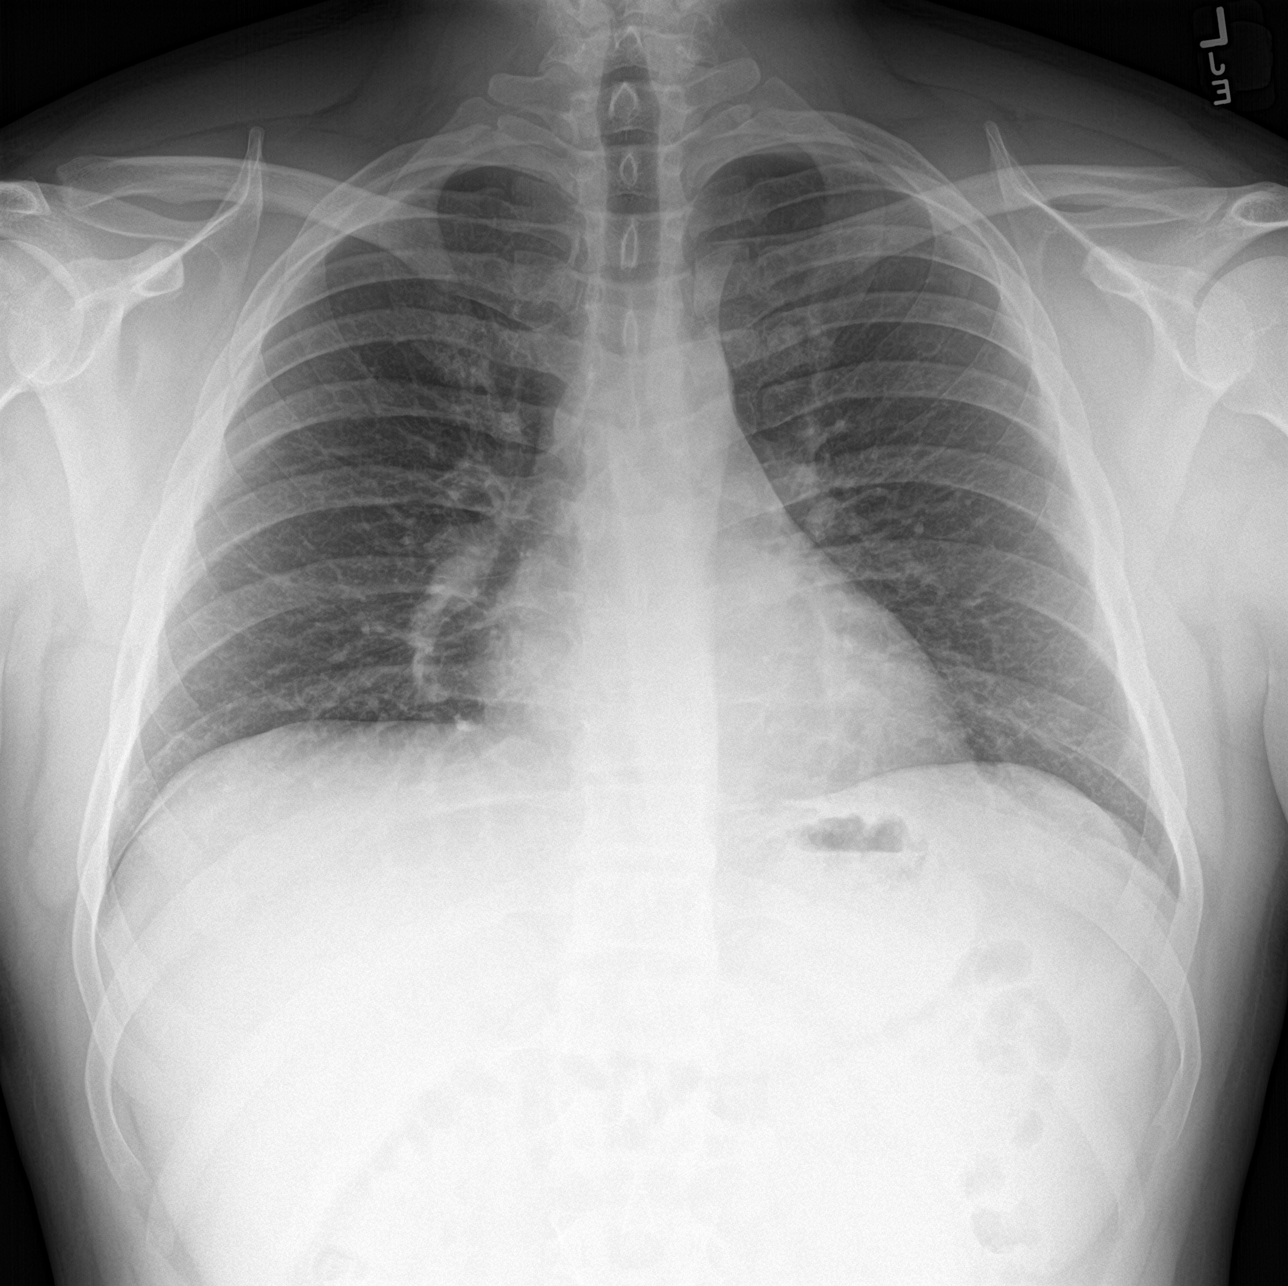

[rib ap]
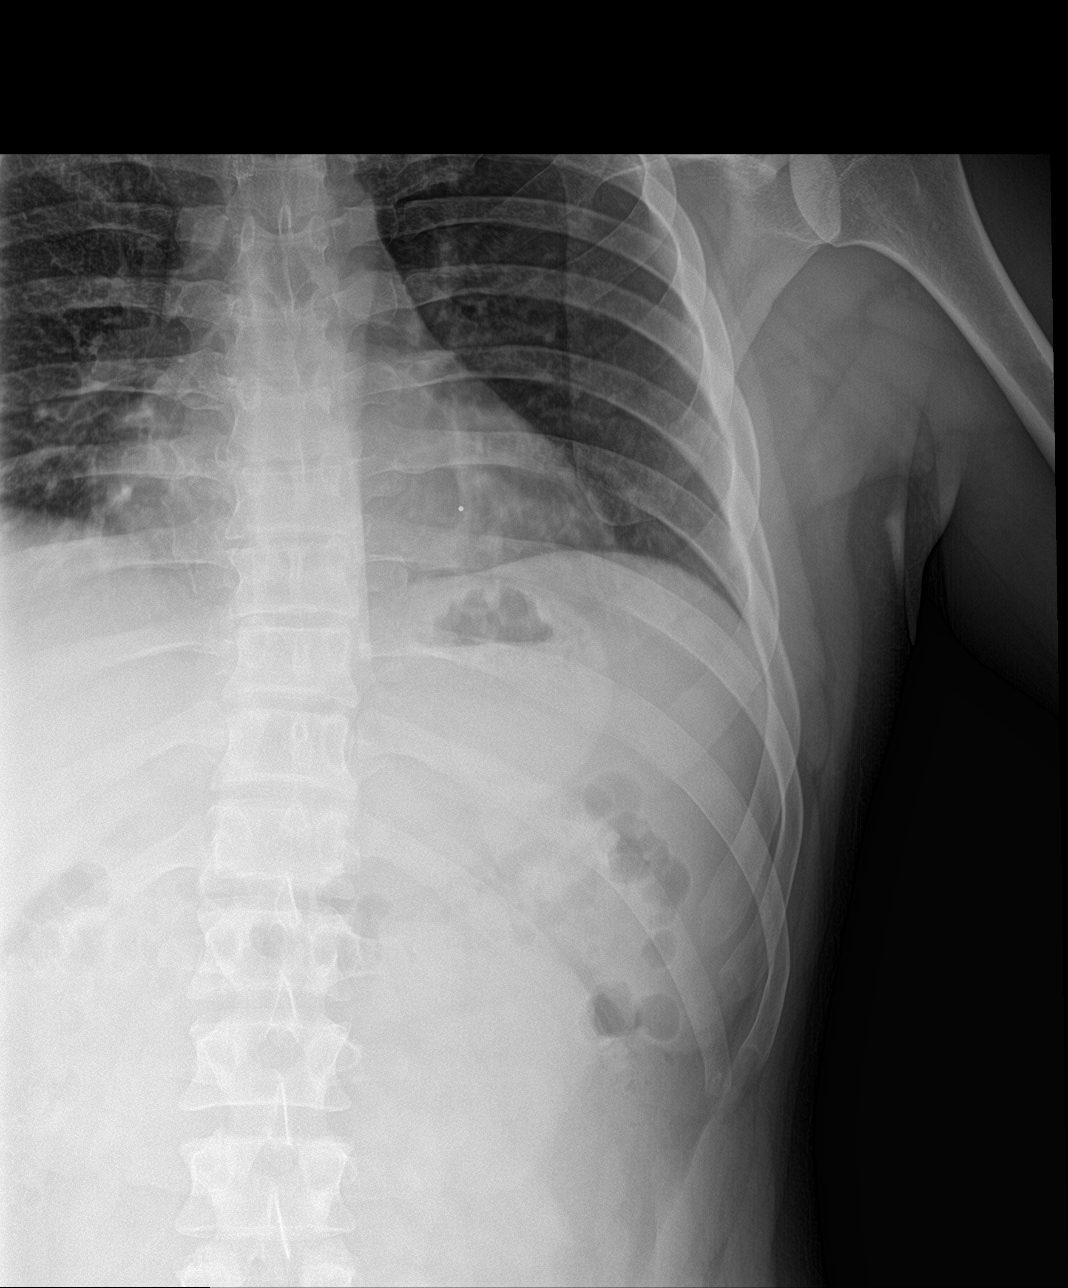

[rib ap obl]
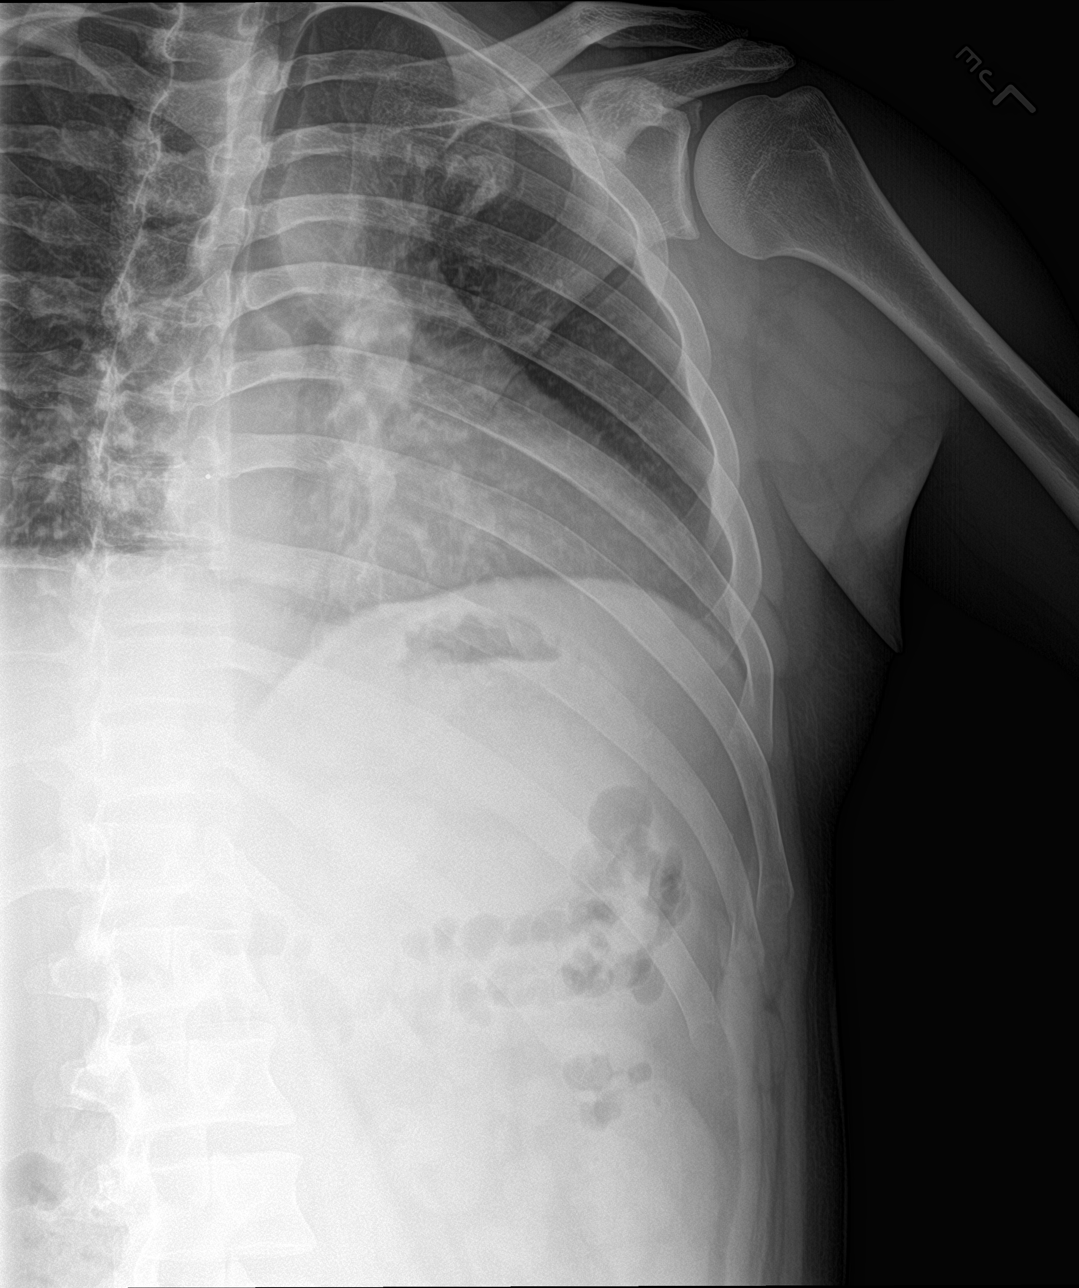

[3 of 3 positions shown; findings below may reference images not displayed]

FINDINGS: No fracture or other bone lesions are seen involving the ribs. There
is no evidence of pneumothorax or pleural effusion. Both lungs are
clear. Heart size and mediastinal contours are within normal limits.
IMPRESSION: Negative.

## 2022-10-06 ENCOUNTER — Other Ambulatory Visit: Payer: Self-pay

## 2022-10-06 ENCOUNTER — Emergency Department (HOSPITAL_COMMUNITY): Payer: Self-pay

## 2022-10-06 ENCOUNTER — Encounter (HOSPITAL_COMMUNITY): Payer: Self-pay | Admitting: Emergency Medicine

## 2022-10-06 ENCOUNTER — Emergency Department (HOSPITAL_COMMUNITY)
Admission: EM | Admit: 2022-10-06 | Discharge: 2022-10-06 | Disposition: A | Discharge status: Home / Self Care | Payer: Self-pay | Attending: Emergency Medicine | Admitting: Emergency Medicine

## 2022-10-06 DIAGNOSIS — M10072 Idiopathic gout, left ankle and foot: Secondary | ICD-10-CM | POA: Insufficient documentation

## 2022-10-06 DIAGNOSIS — M109 Gout, unspecified: Secondary | ICD-10-CM

## 2022-10-06 HISTORY — DX: Unspecified asthma, uncomplicated: J45.909

## 2022-10-06 LAB — CBC
HCT: 46.6 % (ref 39.0–52.0)
Hemoglobin: 16 g/dL (ref 13.0–17.0)
MCH: 29.5 pg (ref 26.0–34.0)
MCHC: 34.3 g/dL (ref 30.0–36.0)
MCV: 85.8 fL (ref 80.0–100.0)
Platelets: 365 10*3/uL (ref 150–400)
RBC: 5.43 MIL/uL (ref 4.22–5.81)
RDW: 13.2 % (ref 11.5–15.5)
WBC: 11.2 10*3/uL — ABNORMAL HIGH (ref 4.0–10.5)
nRBC: 0 % (ref 0.0–0.2)

## 2022-10-06 LAB — BASIC METABOLIC PANEL
Anion gap: 10 (ref 5–15)
BUN: 15 mg/dL (ref 6–20)
CO2: 23 mmol/L (ref 22–32)
Calcium: 9.3 mg/dL (ref 8.9–10.3)
Chloride: 101 mmol/L (ref 98–111)
Creatinine, Ser: 1.16 mg/dL (ref 0.61–1.24)
GFR, Estimated: >60 mL/min (ref >60)
Glucose, Bld: 126 mg/dL — ABNORMAL HIGH (ref 70–99)
Potassium: 4 mmol/L (ref 3.5–5.1)
Sodium: 134 mmol/L — ABNORMAL LOW (ref 135–145)

## 2022-10-06 LAB — URIC ACID: Uric Acid, Serum: 9.7 mg/dL — ABNORMAL HIGH (ref 3.7–8.6)

## 2022-10-06 NOTE — ED Notes (Signed)
DC instructions reviewed with pt. PT verbalized understanding. PT DC °

## 2022-10-06 NOTE — ED Provider Notes (Signed)
[place] [facility] Provider Note   CSN: [id number] Arrival date & time: 10/06/22  4098     History  Chief Complaint  Patient presents with   Ankle Pain    Alejandro Young is a 32 y.o. male with history of unconfirmed gout presented with left ankle pain for the past 3 years.  When asked about unconfirmed history of gout patient states that he has been informally told he has gout but never given a formal diagnosis.  Patient does note that he does eat meat quite often but denies any alcohol use.  Patient states that hurts to bear weight and has not tried any medications.  Patient denies any fevers, recent surgeries, no/vomiting, change in sensation, new onset weakness.    Home Medications Prior to Admission medications   Medication Sig Start Date End Date Taking? Authorizing Provider  benzonatate (TESSALON) 100 MG capsule Take 1 capsule (100 mg total) by mouth every 8 (eight) hours. 01/31/22   White, Elita Boone, NP  methocarbamol (ROBAXIN) 500 MG tablet Take 1 tablet (500 mg total) by mouth 2 (two) times daily. 02/25/22   Rafoth, Baldemar Friday, MD  predniSONE (DELTASONE) 20 MG tablet Take 2 tablets (40 mg total) by mouth daily. 01/31/22   White, Elita Boone, NP  promethazine-dextromethorphan (PROMETHAZINE-DM) 6.25-15 MG/5ML syrup Take 5 mLs by mouth 4 (four) times daily as needed for cough. 01/31/22   Valinda Hoar, NP      Allergies    Amoxicillin and Penicillins    Review of Systems   Review of Systems See HPI Physical Exam Updated Vital Signs BP (!) 136/103 (BP Location: Right Arm)   Pulse (!) 102   Temp 99 F (37.2 C)   Resp 16   Ht 5\' 10"  (1.778 m)   Wt 108.9 kg   SpO2 98%   BMI 34.44 kg/m  Physical Exam Constitutional:      General: He is not in acute distress.    Comments: Resting comfortably on his phone  Cardiovascular:     Pulses: Normal pulses.  Musculoskeletal:     Comments: Able to wiggle toes but cannot range ankle due  to pain No step-off/crepitus/ecchymosis palpated Tender when palpating left ankle  Skin:    General: Skin is warm and dry.     Capillary Refill: Capillary refill takes less than 2 seconds.     Comments: Erythema noted over lateral malleolus Not warm to touch  Neurological:     Mental Status: He is alert.     ED Results / Procedures / Treatments   Labs (all labs ordered are listed, but only abnormal results are displayed) Labs Reviewed  CBC - Abnormal; Notable for the following components:      Result Value   WBC 11.2 (*)    All other components within normal limits  BASIC METABOLIC PANEL - Abnormal; Notable for the following components:   Sodium 134 (*)    Glucose, Bld 126 (*)    All other components within normal limits  URIC ACID - Abnormal; Notable for the following components:   Uric Acid, Serum 9.7 (*)    All other components within normal limits    EKG None  Radiology DG Ankle Complete Left  Result Date: 10/06/2022 CLINICAL DATA:  swelling and pain EXAM: LEFT ANKLE COMPLETE - 3+ VIEW COMPARISON:  10/12/2019 chronic FINDINGS: There is no evidence of fracture, dislocation. Moderate effusion. Slowly enlarging Stieda process. Chronic cortical irregularity dorsally from the  distal talus. There is no evidence of arthropathy or other focal bone abnormality. Soft tissues are unremarkable. IMPRESSION: Moderate effusion. No acute bone abnormality. Electronically Signed   By: Corlis Leak M.D.   On: 10/06/2022 14:02    Procedures Procedures    Medications Ordered in ED Medications - No data to display  ED Course/ Medical Decision Making/ A&P                             Medical Decision Making Amount and/or Complexity of Data Reviewed Labs: ordered. Radiology: ordered.   Alejandro Young 32 y.o. presented today for left ankle pain. Working DDx that I considered at this time includes, but not limited to, gout, ankle sprain fracture, dislocation, neurovascular compromise,  ischemic limb, compartment syndrome.  R/o DDx: ankle sprain fracture, dislocation, neurovascular compromise, ischemic limb, compartment syndrome: These are considered less likely due to history of present illness and physical exam findings  Review of prior external notes: 02/25/2022 ED  Unique Tests and My Interpretation:  Uric acid: Elevated 9.7 BMP: Unremarkable CBC: Unremarkable Left ankle x-ray: Moderate effusion, no osseous changes  Discussion with Independent Historian: None  Discussion of Management of Tests: None  Risk: Medium: prescription drug management  Risk Stratification Score: None  Plan: Patient presented for left ankle pain. On exam patient was no acute distress stable vitals.  Patient was resting comfortably on his phone.  On exam patient did have swelling left ankle that did have mild erythema.  Patient states this is consistent with his previous gout exacerbations but then states he has not done formal diagnosis of gout but has been told he has gout in the past.  Patient's x-ray did show moderate effusion without osseous changes.  Due to patient's elevated uric acid and upon going to chart review has been treated for gout exacerbations patient will be treated for gout.  I spoke to patient value may take 800 mg ibuprofen 3 times a day for 5 days and that he will need to follow-up with her primary care provider.  Patient does have good kidney function and no previous kidney issues.  Patient was given return precautions. Patient stable for discharge at this time.  Patient verbalized understanding of plan.         Final Clinical Impression(s) / ED Diagnoses Final diagnoses:  Acute gout of left ankle, unspecified cause    Rx / DC Orders ED Discharge Orders     None         Remi Deter 10/06/22 1408    Mardene Sayer, MD 10/06/22 1911

## 2022-10-06 NOTE — ED Triage Notes (Signed)
Pt reports he has had left ankle pain off and on for the last three years. States was seen recently by a foot doctor and given xrays which did not show anything acute. Was diagnosed with gout. Denies recent injury. Questionable fever. Pt tolerates some weight bearing on extremity.

## 2022-10-06 NOTE — Discharge Instructions (Addendum)
Today your labs showed that you most likely have a gout exacerbation causing your symptoms.  You may take ibuprofen 800 mg 3 times a day for 5 days.  I have also given you a primary care provider for you to follow-up so that you may be placed on prophylactic medication for your gout.  If symptoms change or worsen please return to ER.
